# Patient Record
Sex: Female | Born: 1945 | Race: White | Hispanic: No | Marital: Single | Smoking: Former smoker
Health system: Southern US, Community
[De-identification: ages and names within clinical notes are randomized; demographics above are authoritative.]

## PROBLEM LIST (undated history)

## (undated) DIAGNOSIS — N94819 Vulvodynia, unspecified: Secondary | ICD-10-CM

## (undated) DIAGNOSIS — F329 Major depressive disorder, single episode, unspecified: Secondary | ICD-10-CM

## (undated) DIAGNOSIS — T7840XA Allergy, unspecified, initial encounter: Secondary | ICD-10-CM

## (undated) DIAGNOSIS — F1010 Alcohol abuse, uncomplicated: Secondary | ICD-10-CM

## (undated) DIAGNOSIS — Z973 Presence of spectacles and contact lenses: Secondary | ICD-10-CM

## (undated) DIAGNOSIS — C801 Malignant (primary) neoplasm, unspecified: Secondary | ICD-10-CM

## (undated) DIAGNOSIS — M1990 Unspecified osteoarthritis, unspecified site: Secondary | ICD-10-CM

## (undated) DIAGNOSIS — J309 Allergic rhinitis, unspecified: Secondary | ICD-10-CM

## (undated) DIAGNOSIS — Z809 Family history of malignant neoplasm, unspecified: Secondary | ICD-10-CM

## (undated) DIAGNOSIS — H9190 Unspecified hearing loss, unspecified ear: Secondary | ICD-10-CM

## (undated) DIAGNOSIS — IMO0001 Reserved for inherently not codable concepts without codable children: Secondary | ICD-10-CM

## (undated) DIAGNOSIS — F32A Depression, unspecified: Secondary | ICD-10-CM

## (undated) DIAGNOSIS — D1803 Hemangioma of intra-abdominal structures: Secondary | ICD-10-CM

## (undated) HISTORY — DX: Allergy, unspecified, initial encounter: T78.40XA

## (undated) HISTORY — DX: Alcohol abuse, uncomplicated: F10.10

## (undated) HISTORY — DX: Hemangioma of intra-abdominal structures: D18.03

## (undated) HISTORY — PX: OTHER SURGICAL HISTORY: SHX169

## (undated) HISTORY — DX: Depression, unspecified: F32.A

## (undated) HISTORY — DX: Allergic rhinitis, unspecified: J30.9

## (undated) HISTORY — DX: Major depressive disorder, single episode, unspecified: F32.9

## (undated) HISTORY — DX: Reserved for inherently not codable concepts without codable children: IMO0001

## (undated) HISTORY — DX: Malignant (primary) neoplasm, unspecified: C80.1

## (undated) HISTORY — PX: WISDOM TOOTH EXTRACTION: SHX21

## (undated) HISTORY — DX: Vulvodynia, unspecified: N94.819

## (undated) HISTORY — PX: TONSILLECTOMY: SUR1361

## (undated) HISTORY — DX: Presence of spectacles and contact lenses: Z97.3

## (undated) HISTORY — DX: Unspecified hearing loss, unspecified ear: H91.90

## (undated) HISTORY — DX: Family history of malignant neoplasm, unspecified: Z80.9

## (undated) HISTORY — DX: Unspecified osteoarthritis, unspecified site: M19.90

---

## 2000-08-27 DIAGNOSIS — C801 Malignant (primary) neoplasm, unspecified: Secondary | ICD-10-CM

## 2000-08-27 HISTORY — DX: Malignant (primary) neoplasm, unspecified: C80.1

## 2000-08-27 HISTORY — PX: MASTECTOMY: SHX3

## 2000-10-31 ENCOUNTER — Other Ambulatory Visit: Admission: RE | Admit: 2000-10-31 | Discharge: 2000-10-31 | Payer: Self-pay | Admitting: Radiology

## 2000-11-05 ENCOUNTER — Other Ambulatory Visit: Admission: RE | Admit: 2000-11-05 | Discharge: 2000-11-05 | Payer: Self-pay | Admitting: Surgery

## 2000-12-02 ENCOUNTER — Encounter (INDEPENDENT_AMBULATORY_CARE_PROVIDER_SITE_OTHER): Payer: Self-pay | Admitting: Specialist

## 2000-12-02 ENCOUNTER — Inpatient Hospital Stay (HOSPITAL_COMMUNITY): Admission: RE | Admit: 2000-12-02 | Discharge: 2000-12-04 | Payer: Self-pay | Admitting: Surgery

## 2001-01-06 ENCOUNTER — Encounter: Payer: Self-pay | Admitting: Oncology

## 2001-01-06 ENCOUNTER — Ambulatory Visit (HOSPITAL_COMMUNITY): Admission: RE | Admit: 2001-01-06 | Discharge: 2001-01-06 | Payer: Self-pay | Admitting: Oncology

## 2001-07-09 ENCOUNTER — Encounter: Admission: RE | Admit: 2001-07-09 | Discharge: 2001-07-09 | Payer: Self-pay | Admitting: *Deleted

## 2001-07-09 ENCOUNTER — Encounter: Payer: Self-pay | Admitting: *Deleted

## 2001-07-30 ENCOUNTER — Encounter: Payer: Self-pay | Admitting: *Deleted

## 2001-07-30 ENCOUNTER — Encounter: Admission: RE | Admit: 2001-07-30 | Discharge: 2001-07-30 | Payer: Self-pay | Admitting: *Deleted

## 2001-08-05 ENCOUNTER — Ambulatory Visit (HOSPITAL_COMMUNITY): Admission: RE | Admit: 2001-08-05 | Discharge: 2001-08-05 | Payer: Self-pay | Admitting: Gastroenterology

## 2003-08-28 DIAGNOSIS — D1803 Hemangioma of intra-abdominal structures: Secondary | ICD-10-CM

## 2003-08-28 HISTORY — DX: Hemangioma of intra-abdominal structures: D18.03

## 2003-12-13 ENCOUNTER — Other Ambulatory Visit: Admission: RE | Admit: 2003-12-13 | Discharge: 2003-12-13 | Payer: Self-pay | Admitting: Family Medicine

## 2003-12-13 LAB — HM PAP SMEAR

## 2003-12-20 ENCOUNTER — Encounter: Admission: RE | Admit: 2003-12-20 | Discharge: 2003-12-20 | Payer: Self-pay | Admitting: Family Medicine

## 2003-12-27 ENCOUNTER — Ambulatory Visit (HOSPITAL_COMMUNITY): Admission: RE | Admit: 2003-12-27 | Discharge: 2003-12-27 | Payer: Self-pay | Admitting: Family Medicine

## 2005-01-29 ENCOUNTER — Ambulatory Visit: Payer: Self-pay | Admitting: Oncology

## 2006-01-28 ENCOUNTER — Ambulatory Visit: Payer: Self-pay | Admitting: Oncology

## 2006-01-30 LAB — COMPREHENSIVE METABOLIC PANEL
ALT: 20 U/L (ref 0–40)
AST: 17 U/L (ref 0–37)
Albumin: 4.2 g/dL (ref 3.5–5.2)
Alkaline Phosphatase: 68 U/L (ref 39–117)
Calcium: 9.3 mg/dL (ref 8.4–10.5)
Chloride: 105 mEq/L (ref 96–112)
Potassium: 4.9 mEq/L (ref 3.5–5.3)
Sodium: 141 mEq/L (ref 135–145)
Total Protein: 6.8 g/dL (ref 6.0–8.3)

## 2006-01-30 LAB — CBC WITH DIFFERENTIAL/PLATELET
BASO%: 0.7 % (ref 0.0–2.0)
EOS%: 3.6 % (ref 0.0–7.0)
HGB: 13 g/dL (ref 11.6–15.9)
MCH: 32.2 pg (ref 26.0–34.0)
MCV: 96.5 fL (ref 81.0–101.0)
MONO%: 12.4 % (ref 0.0–13.0)
RBC: 4.04 10*6/uL (ref 3.70–5.32)
RDW: 13.8 % (ref 11.3–14.5)
lymph#: 1.5 10*3/uL (ref 0.9–3.3)

## 2007-01-30 ENCOUNTER — Ambulatory Visit: Payer: Self-pay | Admitting: Oncology

## 2007-02-03 LAB — CBC WITH DIFFERENTIAL/PLATELET
Eosinophils Absolute: 0.1 10*3/uL (ref 0.0–0.5)
MONO#: 0.5 10*3/uL (ref 0.1–0.9)
NEUT#: 2.8 10*3/uL (ref 1.5–6.5)
Platelets: 251 10*3/uL (ref 145–400)
RBC: 4.19 10*6/uL (ref 3.70–5.32)
RDW: 12.8 % (ref 11.3–14.5)
WBC: 4.7 10*3/uL (ref 3.9–10.0)

## 2007-02-03 LAB — COMPREHENSIVE METABOLIC PANEL
Albumin: 4.4 g/dL (ref 3.5–5.2)
CO2: 24 mEq/L (ref 19–32)
Glucose, Bld: 76 mg/dL (ref 70–99)
Potassium: 4.4 mEq/L (ref 3.5–5.3)
Sodium: 142 mEq/L (ref 135–145)
Total Protein: 6.7 g/dL (ref 6.0–8.3)

## 2007-02-03 LAB — LACTATE DEHYDROGENASE: LDH: 139 U/L (ref 94–250)

## 2008-02-04 LAB — HM MAMMOGRAPHY

## 2008-02-19 ENCOUNTER — Ambulatory Visit: Payer: Self-pay | Admitting: Family Medicine

## 2008-02-23 ENCOUNTER — Ambulatory Visit: Payer: Self-pay | Admitting: Oncology

## 2008-02-26 LAB — COMPREHENSIVE METABOLIC PANEL
ALT: 23 U/L (ref 0–35)
AST: 18 U/L (ref 0–37)
Albumin: 4.5 g/dL (ref 3.5–5.2)
BUN: 14 mg/dL (ref 6–23)
CO2: 27 mEq/L (ref 19–32)
Calcium: 10 mg/dL (ref 8.4–10.5)
Chloride: 105 mEq/L (ref 96–112)
Creatinine, Ser: 0.77 mg/dL (ref 0.40–1.20)
Potassium: 5 mEq/L (ref 3.5–5.3)

## 2008-02-26 LAB — CBC WITH DIFFERENTIAL/PLATELET
BASO%: 0.3 % (ref 0.0–2.0)
Basophils Absolute: 0 10*3/uL (ref 0.0–0.1)
EOS%: 1.7 % (ref 0.0–7.0)
HCT: 40.3 % (ref 34.8–46.6)
HGB: 13.7 g/dL (ref 11.6–15.9)
MCH: 31.8 pg (ref 26.0–34.0)
MONO#: 0.7 10*3/uL (ref 0.1–0.9)
NEUT%: 75.9 % (ref 39.6–76.8)
RDW: 13.3 % (ref 11.3–14.5)
WBC: 9.5 10*3/uL (ref 3.9–10.0)
lymph#: 1.4 10*3/uL (ref 0.9–3.3)

## 2008-02-26 LAB — LACTATE DEHYDROGENASE: LDH: 164 U/L (ref 94–250)

## 2008-04-26 ENCOUNTER — Ambulatory Visit: Payer: Self-pay | Admitting: Family Medicine

## 2009-08-27 HISTORY — PX: COLONOSCOPY: SHX174

## 2009-08-31 ENCOUNTER — Ambulatory Visit: Payer: Self-pay | Admitting: Family Medicine

## 2010-05-23 ENCOUNTER — Encounter: Admission: RE | Admit: 2010-05-23 | Discharge: 2010-05-23 | Payer: Self-pay | Admitting: Family Medicine

## 2010-05-23 ENCOUNTER — Ambulatory Visit: Payer: Self-pay | Admitting: Family Medicine

## 2011-01-12 NOTE — Op Note (Signed)
[place]. [facility]  Patient:    Emily Cowan, Emily Cowan                        MRN: [id number] Proc. Date: 12/02/00 Adm. Date:  [id number] Attending:  Bonnetta Barry CC:         Jeralyn Ruths, M.D.  Heather Roberts, M.D.  Teena Irani. Odis Luster, M.D.   Operative Report  PREOPERATIVE DIAGNOSIS:  Right breast carcinoma.  POSTOPERATIVE DIAGNOSIS:  Right breast carcinoma.  OPERATION PERFORMED:  Right total mastectomy and sentinel lymph node biopsy.  SURGEON:  Velora Heckler, M.D.  ASSISTANT:  Rose Phi. Maple Hudson, M.D.  ANESTHESIA:  General.  ESTIMATED BLOOD LOSS:  Less than 50 cc.  PREPARATION:  Betadine.  COMPLICATIONS:  None.  INDICATIONS FOR PROCEDURE:  The patient is a 65 year old white female who presents with newly diagnosed right breast carcinoma.  The patient had noted a mass on routine monthly self examination.  She was seen by Dr. Heather Roberts and referred to Dr. Jeralyn Ruths for mammogram, ultrasound,  fine needle aspiration were performed.  This demonstrated atypical epithelial cells.  On November 05, 2000 the patient underwent core needle biopsy.  This confirmed invasive mammary carcinoma.  The patient was seen and evaluated by general surgery and is now brought to the operating room for total mastectomy, sentinel lymph node biopsy, and immediate reconstruction by Dr. Etter Sjogren from plastic surgery.  DESCRIPTION OF PROCEDURE:  The procedure was done in OR #17 at the [facility].  The patient was brought to the operating room and placed in supine position on the operating room table.  Following administration of general anesthesia, the patient was prepped and draped in the usual strict aseptic fashion.  After ascertaining that an adequate level of anesthesia had been obtained, the right breast was injected with Lymphazurin blue dye.  This was massaged for five minutes.  Neoprobe was used to map the right breast and  right axilla.  The sentinel lymph node site was marked with a marking pen. The patient was then prepped and draped  draped in the usual strict aseptic fashion.  The right axillary incision was then made with a #10 blade.  Dissection was carried down through subcutaneous tissues and hemostasis obtained with the electrocautery.  Blue lymphatic vessels were identified and followed into the low axilla.  Several small less than 1 cm lymph nodes were identified which were both radioactive and blue in color. These were excised sequentially and submitted to pathology for review.  The first four  such lymph nodes were submitted to Touch Prep and read by Dr. Hali Marry as negative for metastatic carcinoma.  Two additional lymph nodes were submitted for permanent sectioning.  Good hemostasis was noted in the axilla.  Next, an elliptical incision was made on the right chest, so as to encompass the entire nipple areolar complex.  Skin flaps were developed circumferentially using the traditional anatomic landmarks of the clavicle superiorly, the sternum medioally, the rectus sheath inferiorly and the latissimus dorsi laterally.  The breast was then reflected off of the chest wall including the fascia with the breast in leaving the pectoralis major muscle intact.  Arterial tributaries were divided between small and medium Ligaclips.  The entire breast was excised laterally and marked with a suture in the lateral position.  It was then submitted to pathology for review.  Good hemostasis was noted throughout.  The wound was irrigated copiously with  warm saline which was evacuated.  A moistened gauze pack was placed within the subcutaneous space.  The axillary wound was closed with interrupted 3-0 Vicryl sutures in the subcutaneous tissues.  The skin was closed with stainless steel staples.  At this point Dr. Etter Sjogren assumed control of the case and will dictate his reconstruction under separate  operative report.  The patient tolerated this portion of the surgery very well. DD:  12/02/00 TD:  12/02/00 Job: [id number]

## 2011-01-12 NOTE — Procedures (Signed)
[place]. [facility]  Patient:    Emily Cowan, Emily Cowan Visit Number: [id number] MRN: [id number]          Service Type: END Location: ENDO Attending Physician:  Charna Elizabeth Dictated by:   Anselmo Rod, M.D. Proc. Date: 08/05/01 Admit Date:  08/05/2001                             Procedure Report  DATE OF BIRTH:  04-15-1946  REFERRING PHYSICIAN:  Heather Roberts, M.D.  PROCEDURE PERFORMED:  Colonoscopy.  ENDOSCOPIST:  Anselmo Rod, M.D.  INSTRUMENT USED:  Olympus video colonoscope.  INDICATIONS FOR PROCEDURE:  Screening colonoscopy done in a 65 year old white female with a personal history of breast cancer and guaiac positive stools and and colonic polyps.  The patient also has a family history of GI cancers.  PREPROCEDURE PREPARATION:  Informed consent was procured from the patient. The patient was fasted for eight hours prior to the procedure and prepped with a bottle of magnesium citrate and a gallon of NuLytely the night prior to the procedure.  PREPROCEDURE PHYSICAL:  The patient had stable vital signs.  Neck supple. Chest clear to auscultation.  S1, S2 regular.  Abdomen soft with normal abdominal bowel sounds.  DESCRIPTION OF PROCEDURE:  The patient was placed in the left lateral decubitus position and sedated with 40 mg of Demerol and 4 mg of Versed intravenously.  Once the patient was adequately sedated and maintained on low-flow oxygen and continuous cardiac monitoring, the Olympus video colonoscope was advanced from the rectum to the cecum with slight difficulty secondary to some residual stool in the colon and multiple washes were done. No masses, polyps, erosions, ulcerations or diverticula were seen.  There was no evidence of hemorrhoids.  The procedure was completed to the cecum.  The ileocecal valve and the appendicular orifices were clearly visualized and photographed.  IMPRESSION: 1. Healthy-appearing colon, no masses or  polyps seen. 2. A small amount of residual stool in the colon, very small lesions could    have been missed. 3. No masses, polyps, hemorrhoids or diverticulosis seen.  RECOMMENDATIONS: 1. Repeat guaiac testing on an outpatient basis. 2. Repeat colorectal cancer screening in the next five years unless the    patient were to develop any abnormal symptoms in the interim. Dictated by:   Anselmo Rod, M.D. Attending Physician:  Charna Elizabeth DD:  08/05/01 TD:  08/05/01 Job: [id number]

## 2011-01-12 NOTE — Discharge Summary (Signed)
[place]. [facility]  Patient:    Emily Cowan, Emily Cowan                        MRN: [id number] Adm. Date:  [id number] Disc. Date: 12/04/00 Attending:  Bonnetta Barry                           Discharge Summary  DISCHARGE DIAGNOSIS:  Right breast carcinoma.  PROCEDURES: 1. Right total mastectomy with sentinel lymph node biopsy. 2. Right breast reconstruction using tissue expander.  HISTORY OF PRESENT ILLNESS:  This is a 65 year old woman who had a right suspicious area identified on right breast on mammography that confirmed a mass that had been palpated on self-exam.  Dr. Yolanda Bonine performed fine-needle aspirate that revealed atypical epithelial cells and she underwent a core needle biopsy that confirmed invasive mammary carcinoma.  After evaluation by Dr. Gerrit Friends, a total mastectomy with sentinel lymph node biopsy was planned. On evaluation by plastic surgery, she elected reconstruction using tissue expander followed as a stage procedure by an implant.  For further details of history and physical, please see the chart.  HOSPITAL COURSE:  On admission, her BMP and CBC were within normal limits. She underwent mastectomy and reconstruction with tissue expander and tolerated those procedures well.  Four separate sentinel lymph nodes were biopsied and were negative on frozen section.  Postoperatively, she did well.  She had low-grade fever of 100 degrees that responded to incentive spirometry.  Chest is very soft with no evidence of any fluid collections.  Skin flaps look healthy.  Drain is functioning.  It was felt that she was ready to be discharged.  DIET:  Regular diet.  ACTIVITY:  No lifting or raising of her right arm.  DISCHARGE MEDICATIONS: 1. Vicodin for pain. 2. Keflex 250 mg p.o. q.i.d. 3. Use incentive spirometry at least four times a day.  FOLLOWUP:  Follow up with Dr. Gerrit Friends in 10 days.  Follow up with Dr. Odis Luster in six days. DD:   12/04/00 TD:  12/04/00 Job: [id number]

## 2011-04-19 ENCOUNTER — Telehealth: Payer: Self-pay

## 2011-04-19 NOTE — Telephone Encounter (Signed)
Call pt to let her know she needs appt

## 2011-04-20 NOTE — Telephone Encounter (Signed)
Pt informed to make appt

## 2011-04-20 NOTE — Telephone Encounter (Signed)
Called for the 2nd time pt informed of

## 2011-04-26 ENCOUNTER — Encounter: Payer: Self-pay | Admitting: Family Medicine

## 2011-05-02 ENCOUNTER — Encounter: Payer: Self-pay | Admitting: Family Medicine

## 2011-05-02 ENCOUNTER — Ambulatory Visit (INDEPENDENT_AMBULATORY_CARE_PROVIDER_SITE_OTHER): Payer: Medicare Other | Admitting: Family Medicine

## 2011-05-02 DIAGNOSIS — Z0000 Encounter for general adult medical examination without abnormal findings: Secondary | ICD-10-CM

## 2011-05-02 DIAGNOSIS — Z23 Encounter for immunization: Secondary | ICD-10-CM

## 2011-05-02 DIAGNOSIS — Z853 Personal history of malignant neoplasm of breast: Secondary | ICD-10-CM

## 2011-05-02 DIAGNOSIS — F341 Dysthymic disorder: Secondary | ICD-10-CM

## 2011-05-02 DIAGNOSIS — J301 Allergic rhinitis due to pollen: Secondary | ICD-10-CM | POA: Insufficient documentation

## 2011-05-02 DIAGNOSIS — E559 Vitamin D deficiency, unspecified: Secondary | ICD-10-CM

## 2011-05-02 DIAGNOSIS — M810 Age-related osteoporosis without current pathological fracture: Secondary | ICD-10-CM | POA: Insufficient documentation

## 2011-05-02 LAB — CBC WITH DIFFERENTIAL/PLATELET
Basophils Absolute: 0 10*3/uL (ref 0.0–0.1)
Basophils Relative: 1 % (ref 0–1)
Eosinophils Absolute: 0.1 10*3/uL (ref 0.0–0.7)
Eosinophils Relative: 3 % (ref 0–5)
Lymphs Abs: 1.7 10*3/uL (ref 0.7–4.0)
MCH: 31.9 pg (ref 26.0–34.0)
MCHC: 33.5 g/dL (ref 30.0–36.0)
MCV: 95.3 fL (ref 78.0–100.0)
Neutrophils Relative %: 44 % (ref 43–77)
Platelets: 283 10*3/uL (ref 150–400)
RBC: 4.07 MIL/uL (ref 3.87–5.11)
RDW: 15.6 % — ABNORMAL HIGH (ref 11.5–15.5)

## 2011-05-02 MED ORDER — RISEDRONATE SODIUM 150 MG PO TABS: 150.0000 mg | ORAL_TABLET | ORAL | Status: AC

## 2011-05-02 NOTE — Progress Notes (Signed)
  Subjective:    Patient ID: Emily Cowan, female    DOB: 05-13-1946, 65 y.o.   MRN: [id number]  HPI She is here for complete examination. She does have underlying allergies and is handling them well. She also has a history of depression since 2002. She has been on Prozac and states that this makes her feel quite well and she is not interested in stopping. She admits to having depression symptoms probably for a much longer period of time. She also had a recent DEXA scan done which did show evidence of osteoporosis. She is now semiretired and enjoying her life. She keeps quite busy. She did have one episode of blurred vision on the left it lasted approximately 45 seconds but has had no headache, nausea, weakness, numbness or tingling. She also has started a diet program. She has been using the Atkins program. She has no other concerns or complaints. The rest of her history and social history was reviewed. She does not want a Pap done.   Review of Systems  Constitutional: Negative.   HENT: Negative.   Eyes: Negative.   Respiratory: Negative.   Cardiovascular: Negative.   Gastrointestinal: Negative.   Genitourinary: Negative.   Musculoskeletal: Negative.   Neurological: Negative.   Hematological: Negative.   Psychiatric/Behavioral: Negative.        Objective:   Physical Exam BP 102/60  Pulse 73  Ht 5\' 7"  (1.702 m)  Wt 150 lb (68.04 kg)  BMI 23.49 kg/m2  General Appearance:    Alert, cooperative, no distress, appears stated age  Head:    Normocephalic, without obvious abnormality, atraumatic  Eyes:    PERRL, conjunctiva/corneas clear, EOM's intact, fundi    benign  Ears:    Normal TM's and external ear canals  Nose:   Nares normal, mucosa normal, no drainage or sinus   tenderness  Throat:   Lips, mucosa, and tongue normal; teeth and gums normal  Neck:   Supple, no lymphadenopathy;  thyroid:  no   enlargement/tenderness/nodules; no carotid   bruit or JVD  Back:    Spine nontender, no  curvature, ROM normal, no CVA     tenderness  Lungs:     Clear to auscultation bilaterally without wheezes, rales or     ronchi; respirations unlabored  Chest Wall:    No tenderness or deformity   Heart:    Regular rate and rhythm, S1 and S2 normal, no murmur, rub   or gallop  Breast Exam:    Deferred to GYN  Abdomen:     Soft, non-tender, nondistended, normoactive bowel sounds,    no masses, no hepatosplenomegaly  Genitalia:    Deferred to GYN     Extremities:   No clubbing, cyanosis or edema  Pulses:   2+ and symmetric all extremities  Skin:   Skin color, texture, turgor normal, no rashes or lesions  Lymph nodes:   Cervical, supraclavicular, and axillary nodes normal  Neurologic:   CNII-XII intact, normal strength, sensation and gait; reflexes 2+ and symmetric throughout          Psych:   Normal mood, affect, hygiene and grooming.           Assessment & Plan:  History of breast cancer. Allergic rhinitis. Depression. Osteoporosis. A sample of Actonel was given. I will call this medication in. Watchful waiting concerning the blurred vision. I also encouraged her to use the [employer] or possibly the Mediterranean diet which is more nutritionally sound.

## 2011-05-03 LAB — COMPREHENSIVE METABOLIC PANEL
ALT: 33 U/L (ref 0–35)
ALT: 32 U/L (ref 0–35)
AST: 20 U/L (ref 0–37)
AST: 20 U/L (ref 0–37)
Albumin: 4.1 g/dL (ref 3.5–5.2)
Alkaline Phosphatase: 69 U/L (ref 39–117)
Alkaline Phosphatase: 72 U/L (ref 39–117)
Calcium: 9.7 mg/dL (ref 8.4–10.5)
Chloride: 105 mEq/L (ref 96–112)
Creat: 0.66 mg/dL (ref 0.50–1.10)
Glucose, Bld: 95 mg/dL (ref 70–99)
Potassium: 4.5 mEq/L (ref 3.5–5.3)
Sodium: 140 mEq/L (ref 135–145)
Total Protein: 6.2 g/dL (ref 6.0–8.3)

## 2011-05-03 LAB — LIPID PANEL
LDL Cholesterol: 129 mg/dL — ABNORMAL HIGH (ref 0–99)
LDL Cholesterol: 128 mg/dL — ABNORMAL HIGH (ref 0–99)
Total CHOL/HDL Ratio: 3.2 Ratio
VLDL: 9 mg/dL (ref 0–40)
VLDL: 10 mg/dL (ref 0–40)

## 2011-05-03 LAB — TSH: TSH: 2.041 u[IU]/mL (ref 0.350–4.500)

## 2011-05-04 ENCOUNTER — Other Ambulatory Visit: Payer: Self-pay | Admitting: Family Medicine

## 2011-05-04 MED ORDER — FLUOXETINE HCL 40 MG PO CAPS: 40.0000 mg | ORAL_CAPSULE | Freq: Every day | ORAL | Status: DC

## 2011-05-04 NOTE — Telephone Encounter (Signed)
PT NEEDS PROZAC RO TO GO TO [facility] NOT LOCAL PHARMACY 90 DAY SUPPLY

## 2011-05-04 NOTE — Telephone Encounter (Signed)
Called pt to inform labs ok

## 2011-05-08 ENCOUNTER — Telehealth: Payer: Self-pay

## 2011-05-08 NOTE — Telephone Encounter (Signed)
Called pt to inform her labs look good left message

## 2012-08-13 ENCOUNTER — Telehealth: Payer: Self-pay | Admitting: Family Medicine

## 2012-08-13 ENCOUNTER — Encounter: Payer: Self-pay | Admitting: Medical

## 2012-08-13 ENCOUNTER — Ambulatory Visit (INDEPENDENT_AMBULATORY_CARE_PROVIDER_SITE_OTHER): Payer: Medicare Other | Admitting: Medical

## 2012-08-13 VITALS — BP 110/80 | HR 60 | Temp 97.9°F | Resp 16 | Ht 68.0 in | Wt 164.0 lb

## 2012-08-13 DIAGNOSIS — F329 Major depressive disorder, single episode, unspecified: Secondary | ICD-10-CM

## 2012-08-13 DIAGNOSIS — N94819 Vulvodynia, unspecified: Secondary | ICD-10-CM

## 2012-08-13 DIAGNOSIS — Z0000 Encounter for general adult medical examination without abnormal findings: Secondary | ICD-10-CM

## 2012-08-13 DIAGNOSIS — G4700 Insomnia, unspecified: Secondary | ICD-10-CM

## 2012-08-13 DIAGNOSIS — M810 Age-related osteoporosis without current pathological fracture: Secondary | ICD-10-CM | POA: Insufficient documentation

## 2012-08-13 DIAGNOSIS — Z23 Encounter for immunization: Secondary | ICD-10-CM

## 2012-08-13 DIAGNOSIS — F1010 Alcohol abuse, uncomplicated: Secondary | ICD-10-CM

## 2012-08-13 DIAGNOSIS — D1803 Hemangioma of intra-abdominal structures: Secondary | ICD-10-CM

## 2012-08-13 DIAGNOSIS — Z853 Personal history of malignant neoplasm of breast: Secondary | ICD-10-CM

## 2012-08-13 LAB — CBC WITH DIFFERENTIAL/PLATELET
Eosinophils Absolute: 0.2 10*3/uL (ref 0.0–0.7)
Eosinophils Relative: 3 % (ref 0–5)
HCT: 41.4 % (ref 36.0–46.0)
Hemoglobin: 14 g/dL (ref 12.0–15.0)
Lymphs Abs: 1.9 10*3/uL (ref 0.7–4.0)
MCH: 31.9 pg (ref 26.0–34.0)
MCV: 94.3 fL (ref 78.0–100.0)
Monocytes Absolute: 0.6 10*3/uL (ref 0.1–1.0)
Monocytes Relative: 12 % (ref 3–12)
RBC: 4.39 MIL/uL (ref 3.87–5.11)

## 2012-08-13 LAB — LIPID PANEL
Cholesterol: 207 mg/dL — ABNORMAL HIGH (ref 0–200)
HDL: 57 mg/dL (ref >39)
LDL Cholesterol: 129 mg/dL — ABNORMAL HIGH (ref 0–99)
Triglycerides: 105 mg/dL (ref <150)

## 2012-08-13 LAB — COMPREHENSIVE METABOLIC PANEL
AST: 22 U/L (ref 0–37)
Alkaline Phosphatase: 85 U/L (ref 39–117)
BUN: 12 mg/dL (ref 6–23)
Glucose, Bld: 91 mg/dL (ref 70–99)
Total Bilirubin: 0.5 mg/dL (ref 0.3–1.2)

## 2012-08-13 LAB — VITAMIN B12: Vitamin B-12: 542 pg/mL (ref 211–911)

## 2012-08-13 LAB — POCT URINALYSIS DIPSTICK
Glucose, UA: NEGATIVE
Nitrite, UA: NEGATIVE
Protein, UA: NEGATIVE
Urobilinogen, UA: NEGATIVE

## 2012-08-13 LAB — MAGNESIUM: Magnesium: 2.2 mg/dL (ref 1.5–2.5)

## 2012-08-13 MED ORDER — FLUOXETINE HCL 40 MG PO CAPS: 40.0000 mg | ORAL_CAPSULE | Freq: Every day | ORAL | Status: DC

## 2012-08-13 MED ORDER — ZOLPIDEM TARTRATE 5 MG PO TABS: 5.0000 mg | ORAL_TABLET | Freq: Every evening | ORAL | Status: DC | PRN

## 2012-08-13 NOTE — Patient Instructions (Signed)
Osteoporosis  Take calcium 1200-1500 mg daily  Take Vitamin D 1000 IU daily  We will send you for Bone Density scan  Check on insurance coverage for Reclast  Alcohol use   i recommend 1 or less drinks daily  Consider counseling for depression  Consider alcoholics anonymous (AA) for support   Take extra vitamin B12 and extra folate 1mg  daily OTC  Take a multivitamin daily  Preventative care  We updated your flu vaccine today  We will set you up for repeat mammogram  Get routine daily exercise  Eat a healthy diet including fruits, vegetable, grains such as rice, pasta, breads, and protein from nuts, beans, dairy, and non red meat  We will get a copy of your colonoscopy report  Come in yearly for physical, see your eye doctor and dentist yearly  Use the ambien 1/2 -1 tablet sparingly for help with sleep  I refilled your Prozac today.

## 2012-08-13 NOTE — Telephone Encounter (Signed)
Patient is aware of her appointment at [facility] on 09/02/12 @ 930 am mammogram and bone density. CLS

## 2012-08-13 NOTE — Progress Notes (Signed)
Subjective:   HPI  Emily Cowan is a 66 y.o. female who presents for a complete physical.  Last physical a year ago. This is my first visit with her.   Normally sees Dr. Susann Givens here.    Preventative care: Last ophthalmology visit: YES/ BUT SHE NEEDS A EYE EXAM, she is an optician herself, retired Last dental visit:2013/ JUST SAW THE DENTIST DOESN'T SEE ON A REGULAR Last colonoscopy:2 TO 3 YEARS AGO/ [place] Last mammogram:2010 Last EKG:1998 Last labs:04/2011  Prior vaccinations: TD or Tdap:08/2009 Influenza:YES today Pneumococcal:08/2009 Shingles/Zostavax:08/2009  Advanced directive:YES Health care power of attorney:YES Living will:YES  Concerns: Occasionally wakes up in the middle of the night, takes a while to go back to sleep.  Has tried Ambien before a few times, took 1/4th and slept through the night.  Benadryl makes her feel dopey.  She requests a month supply for prn use.   Gets shaky if she skips meal or if goes a few hours close to lunch without eating.    Prozac - needs refills.  Takes prozac for depression.  She has a friend living with her that was homeless.  Having some financial stress taking this person in. Not seeing counseling.  Reviewed their medical, surgical, family, social, medication, and allergy history and updated chart as appropriate.   Past Medical History  Diagnosis Date  . Depression   . Osteoporosis   . Seborrheic dermatitis   . Cancer 2002    BREAST  . Allergic rhinitis   . Hearing impaired     wears hearing aids  . Wears glasses   . Alcohol abuse   . Family history of cancer     multiple  . Hemangioma of liver 2005    MRI   . Vulvodynia     longstanding, failed Premarin cream    Past Surgical History  Procedure Date  . Mastectomy 2002    rigth  . Tonsillectomy   . Wisdom tooth extraction     Family History  Problem Relation Age of Onset  . Cancer Mother     GI cancer  . Cancer Father     liver  . Stroke Father   .  Cancer Sister     vulvar  . Cancer Brother     liver  . Diabetes Neg Hx   . Heart disease Neg Hx   . Hypertension Neg Hx   . Hyperlipidemia Neg Hx   . Cancer Brother     lung    History   Social History  . Marital Status: Single    Spouse Name: N/A    Number of Children: N/A  . Years of Education: N/A   Occupational History  . Not on file.   Social History Main Topics  . Smoking status: Never Smoker   . Smokeless tobacco: Never Used  . Alcohol Use: 12.6 oz/week    21 Glasses of wine per week  . Drug Use: No  . Sexually Active: Not Currently   Other Topics Concern  . Not on file   Social History Narrative   Single, no children, retired, lesbian but no current relationship; exercise- working in the yard, cutting down trees, Aeronautical engineer, walking the dogs.  Retired Sales executive.    Current Outpatient Prescriptions on File Prior to Visit  Medication Sig Dispense Refill  . FLUoxetine (PROZAC) 40 MG capsule Take 1 capsule (40 mg total) by mouth daily.  90 capsule  4  . Glucosamine-Chondroit-Vit C-Mn (GLUCOSAMINE CHONDR 1500 COMPLX) CAPS  Take 1 capsule by mouth daily.        . Multiple Vitamins-Minerals (MULTIVITAMIN WITH MINERALS) tablet Take 1 tablet by mouth daily.        Marland Kitchen ibuprofen (ADVIL,MOTRIN) 100 MG chewable tablet Chew 100 mg by mouth every 8 (eight) hours as needed.        . zolpidem (AMBIEN) 5 MG tablet Take 1 tablet (5 mg total) by mouth at bedtime as needed for sleep.  20 tablet  0    No Known Allergies   Review of Systems Constitutional: -fever, -chills, -sweats, +unexpected weight change, -decreased appetite, -fatigue Allergy: -sneezing, -itching, -congestion Dermatology: -changing moles, --rash, -lumps ENT: -runny nose, -ear pain, -sore throat, -hoarseness, -sinus pain, -teeth pain, - ringing in ears, -hearing loss, -nosebleeds Cardiology: -chest pain, -palpitations, -swelling, -difficulty breathing when lying flat, -waking up short of breath Respiratory:  -cough, -shortness of breath, -difficulty breathing with exercise or exertion, -wheezing, -coughing up blood Gastroenterology: -abdominal pain, -nausea, -vomiting, -diarrhea, -constipation, -blood in stool, -changes in bowel movement, -difficulty swallowing or eating Hematology: -bleeding, -bruising  Musculoskeletal: +joint aches, -muscle aches, -joint swelling, +back pain, +neck pain, -cramping, -changes in gait Ophthalmology: denies vision changes, eye redness, itching, discharge Urology: -burning with urination, -difficulty urinating, -blood in urine, -urinary frequency, -urgency, -incontinence Neurology: -headache, -weakness, -tingling, -numbness, -memory loss, -falls, -dizziness Psychology: -depressed mood, -agitation, +sleep problems     Objective:   Physical Exam  Reviewed nurse notes, vitals.  General appearance: alert, no distress, WD/WN, white female, looks stated age Skin: dry, scattered macules, freckles, no particular worrisome lesions HEENT: normocephalic, conjunctiva/corneas normal, sclerae anicteric, PERRLA, EOMi, TMs pearly, hearing aids present, nares patent, no discharge or erythema, pharynx normal Oral cavity: MMM, tongue normal, teeth in good repair Neck: supple, no lymphadenopathy, no thyromegaly, no masses, normal ROM, no bruits Chest: non tender, normal shape and expansion Heart: RRR, normal S1, S2, no murmurs Lungs: CTA bilaterally, no wheezes, rhonchi, or rales Abdomen: +bs, soft, non tender, non distended, liver seems enlarged, no masses, no splenomegaly, no bruits Back: non tender, normal ROM, no scoliosis Musculoskeletal: upper extremities non tender, no obvious deformity, normal ROM throughout, lower extremities non tender, no obvious deformity, normal ROM throughout Extremities: no edema, no cyanosis, no clubbing Pulses: 1+ symmetric, upper and lower extremities, normal cap refill Neurological: alert, oriented x 3, CN2-12 intact, strength normal upper  extremities and lower extremities, sensation normal throughout, DTRs 2+ throughout, no cerebellar signs, gait normal Psychiatric: normal affect, behavior normal, pleasant  Breast: right breast with horizontal surgical scar, implant present, otherwise bilat breasts nontender, no masses or lumps, no skin changes, no nipple discharge or inversion, no axillary lymphadenopathy Gyn: mild atrophic changes externally, no worrisome lesions but there does seem to be some hypopigmentation of vaginal introitus suggestive of some sclerosis, she declines a speculum exam.  Palpated pelvic region externally, and uterus and adnexa don't appear enlarged, nontender, no masses.  Exam chaperoned by nurse. Rectal: anus normal appearing, normal tone, no masses, occult negative stool    Assessment and Plan :    Encounter Diagnoses  Name Primary?  . Routine general medical examination at a health care facility Yes  . Osteoporosis   . History of breast cancer   . Depression   . Insomnia   . Alcohol abuse   . Vulvodynia   . Hemangioma of liver   . Need for influenza vaccination     Physical exam - discussed healthy lifestyle, diet, exercise, preventative care, vaccinations, and addressed  their concerns.  Handout given.  osteoporosis - failed boniva.  Wants to try reclast.  She will check insurance coverage and let me know.  We set her up for repeat bone density scan.  Hx/o breast cancer - advised monthly SBE, repeat mammogram  Depression - longstanding, consider counseling.  Refilled prozac, discussed her concerns.    Insomnia - prn sparing use of zolpidem, discussed sleep hygiene  Alcohol abuse - counseled on this, recommended counseling, limiting consumption, advised she consider AA.  Vulvodynia - declines gyn referral, offered premarin cream for atrophic changes, but she declines.  Hemangioma of liver - once we have the preventative things out of the way, consider repeat abdominal imaging.  Need for  influenza vaccine - vaccine counseling, VIS and flu vaccine given  Follow-up pending studies.

## 2012-08-13 NOTE — Addendum Note (Signed)
Addended by: Janeice Robinson on: 08/13/2012 04:06 PM   Modules accepted: Orders

## 2012-08-22 DIAGNOSIS — Z23 Encounter for immunization: Secondary | ICD-10-CM

## 2012-08-22 NOTE — Addendum Note (Signed)
Addended by: Leretha Dykes L on: 08/22/2012 11:25 AM   Modules accepted: Orders

## 2012-09-02 ENCOUNTER — Encounter: Payer: Self-pay | Admitting: Medical

## 2012-09-02 LAB — HM MAMMOGRAPHY

## 2012-10-24 ENCOUNTER — Encounter: Payer: Self-pay | Admitting: Medical

## 2012-10-24 NOTE — Telephone Encounter (Signed)
Rx refill request

## 2012-10-25 ENCOUNTER — Other Ambulatory Visit: Payer: Self-pay | Admitting: Medical

## 2012-10-25 MED ORDER — ZOLPIDEM TARTRATE 5 MG PO TABS: 5.0000 mg | ORAL_TABLET | Freq: Every evening | ORAL | Status: DC | PRN

## 2012-10-27 ENCOUNTER — Telehealth: Payer: Self-pay | Admitting: Family Medicine

## 2012-10-27 NOTE — Progress Notes (Signed)
I called out Ambien to the patients pharmacy. CLS Per Crosby Oyster PA-C

## 2012-10-27 NOTE — Telephone Encounter (Signed)
I called out Ambien per David Tysinger PA-C. CLS 

## 2013-02-11 ENCOUNTER — Encounter: Payer: Self-pay | Admitting: Medical

## 2013-02-11 ENCOUNTER — Telehealth: Payer: Self-pay | Admitting: Family Medicine

## 2013-02-11 NOTE — Telephone Encounter (Signed)
See msg below.  Please send letter as requested for hearing evaluation

## 2013-02-11 NOTE — Telephone Encounter (Signed)
    Would you please fax a note to [facility] stating my need for a hearing test. Think I forgot to mention it when I last saw you. Their fax # is [phone].    Thanks so much. Hope you are well.   Lenore Manner, this was a message from the patient. Can you type the letter and I will fax it to her. CLS

## 2013-02-12 NOTE — Telephone Encounter (Signed)
Forward to [employer]

## 2013-02-16 NOTE — Telephone Encounter (Signed)
Emily Cowan, I look in her chart and I didn't see a letter. Do you type one up? CLS

## 2013-02-16 NOTE — Telephone Encounter (Signed)
Just refer to [facility] for hearing concerns and eval.  This should be sufficient.  Let patient know

## 2013-02-17 ENCOUNTER — Encounter: Payer: Self-pay | Admitting: Family Medicine

## 2013-02-17 NOTE — Telephone Encounter (Signed)
I fax a letter over to the hearing clinic for the patients request. CLS

## 2013-02-27 ENCOUNTER — Encounter: Payer: Self-pay | Admitting: Medical

## 2013-03-02 ENCOUNTER — Encounter: Payer: Self-pay | Admitting: Medical

## 2013-03-02 ENCOUNTER — Encounter: Payer: Self-pay | Admitting: Family Medicine

## 2013-03-03 ENCOUNTER — Encounter: Payer: Self-pay | Admitting: Internal Medicine

## 2013-03-11 ENCOUNTER — Telehealth: Payer: Self-pay | Admitting: Family Medicine

## 2013-03-11 NOTE — Telephone Encounter (Signed)
I called and I left a message for the patient to call back and set up an appointment. CLS

## 2013-03-11 NOTE — Telephone Encounter (Signed)
Message copied by Janeice Robinson on Wed Mar 11, 2013  2:38 PM ------      Message from: Jac Canavan      Created: Tue Mar 03, 2013  8:17 PM       Have her f/u to discuss bone density results, options for therapy ------

## 2013-03-16 ENCOUNTER — Other Ambulatory Visit: Payer: Self-pay | Admitting: Medical

## 2013-03-16 ENCOUNTER — Telehealth: Payer: Self-pay | Admitting: Internal Medicine

## 2013-03-16 MED ORDER — ZOLPIDEM TARTRATE 5 MG PO TABS: 5.0000 mg | ORAL_TABLET | Freq: Every evening | ORAL | Status: DC | PRN

## 2013-03-16 NOTE — Telephone Encounter (Signed)
Refill request for zolpidem tartrate 5mg  to [facility]

## 2013-03-16 NOTE — Telephone Encounter (Signed)
Call it out 

## 2013-03-17 NOTE — Telephone Encounter (Signed)
I called out the patients Ambien per David Tysinger PAC. CLS 

## 2013-04-01 ENCOUNTER — Other Ambulatory Visit: Payer: Self-pay

## 2013-07-02 ENCOUNTER — Other Ambulatory Visit: Payer: Self-pay

## 2013-07-14 ENCOUNTER — Telehealth: Payer: Self-pay | Admitting: Internal Medicine

## 2013-07-14 MED ORDER — ZOLPIDEM TARTRATE 5 MG PO TABS: 5.0000 mg | ORAL_TABLET | Freq: Every evening | ORAL | Status: DC | PRN

## 2013-07-14 NOTE — Telephone Encounter (Signed)
Needs appt

## 2013-07-14 NOTE — Telephone Encounter (Signed)
Refill request for ambien 10mg  to [facility]. Looks like pt has an appt in December for physical

## 2013-07-14 NOTE — Telephone Encounter (Signed)
Called in med 

## 2013-07-14 NOTE — Telephone Encounter (Signed)
I left a detailed message on the voicemail for the patient. CLS

## 2013-08-14 ENCOUNTER — Ambulatory Visit (INDEPENDENT_AMBULATORY_CARE_PROVIDER_SITE_OTHER): Payer: Medicare Other | Admitting: Medical

## 2013-08-14 ENCOUNTER — Encounter: Payer: Self-pay | Admitting: Medical

## 2013-08-14 VITALS — BP 120/82 | HR 72 | Wt 153.0 lb

## 2013-08-14 DIAGNOSIS — G4700 Insomnia, unspecified: Secondary | ICD-10-CM

## 2013-08-14 DIAGNOSIS — Z23 Encounter for immunization: Secondary | ICD-10-CM

## 2013-08-14 DIAGNOSIS — M810 Age-related osteoporosis without current pathological fracture: Secondary | ICD-10-CM

## 2013-08-14 DIAGNOSIS — Z0000 Encounter for general adult medical examination without abnormal findings: Secondary | ICD-10-CM

## 2013-08-14 DIAGNOSIS — F329 Major depressive disorder, single episode, unspecified: Secondary | ICD-10-CM

## 2013-08-14 DIAGNOSIS — F32A Depression, unspecified: Secondary | ICD-10-CM

## 2013-08-14 DIAGNOSIS — M5430 Sciatica, unspecified side: Secondary | ICD-10-CM

## 2013-08-14 DIAGNOSIS — M5431 Sciatica, right side: Secondary | ICD-10-CM

## 2013-08-14 DIAGNOSIS — R209 Unspecified disturbances of skin sensation: Secondary | ICD-10-CM

## 2013-08-14 DIAGNOSIS — R200 Anesthesia of skin: Secondary | ICD-10-CM

## 2013-08-14 DIAGNOSIS — IMO0002 Reserved for concepts with insufficient information to code with codable children: Secondary | ICD-10-CM

## 2013-08-14 DIAGNOSIS — M5410 Radiculopathy, site unspecified: Secondary | ICD-10-CM

## 2013-08-14 LAB — POCT URINALYSIS DIPSTICK
Blood, UA: NEGATIVE
Ketones, UA: NEGATIVE
Protein, UA: NEGATIVE
Spec Grav, UA: 1.01
Urobilinogen, UA: NEGATIVE
pH, UA: 6

## 2013-08-14 LAB — COMPREHENSIVE METABOLIC PANEL
Albumin: 4.1 g/dL (ref 3.5–5.2)
Alkaline Phosphatase: 72 U/L (ref 39–117)
BUN: 19 mg/dL (ref 6–23)
CO2: 30 mEq/L (ref 19–32)
Calcium: 9.4 mg/dL (ref 8.4–10.5)
Creat: 0.66 mg/dL (ref 0.50–1.10)
Glucose, Bld: 86 mg/dL (ref 70–99)
Potassium: 5.1 mEq/L (ref 3.5–5.3)

## 2013-08-14 LAB — CBC
HCT: 39.2 % (ref 36.0–46.0)
Hemoglobin: 13.3 g/dL (ref 12.0–15.0)
MCH: 32.4 pg (ref 26.0–34.0)
RBC: 4.11 MIL/uL (ref 3.87–5.11)

## 2013-08-14 MED ORDER — ASPIRIN 81 MG PO TABS: 81.0000 mg | ORAL_TABLET | Freq: Every day | ORAL | Status: DC

## 2013-08-14 MED ORDER — ZOLPIDEM TARTRATE 5 MG PO TABS: 5.0000 mg | ORAL_TABLET | Freq: Every evening | ORAL | Status: DC | PRN

## 2013-08-14 MED ORDER — ALENDRONATE SODIUM 70 MG PO TABS: 70.0000 mg | ORAL_TABLET | ORAL | Status: DC

## 2013-08-14 MED ORDER — HYDROCODONE-ACETAMINOPHEN 5-325 MG PO TABS: 1.0000 | ORAL_TABLET | Freq: Four times a day (QID) | ORAL | Status: DC | PRN

## 2013-08-14 MED ORDER — FLUOXETINE HCL 40 MG PO CAPS: 40.0000 mg | ORAL_CAPSULE | Freq: Every day | ORAL | Status: DC

## 2013-08-14 NOTE — Addendum Note (Signed)
Addended by: Laureen Ochs F on: 08/14/2013 05:10 PM   Modules accepted: Orders

## 2013-08-14 NOTE — Progress Notes (Signed)
Subjective:   HPI  Emily Cowan is a 67 y.o. female who presents for a complete physical.  Last physical a year ago.   Preventative care: Last ophthalmology visit: has seen eye doctor in last year, early cataracts Last dental visit: sees dentist regularly Last colonoscopy:2 TO 3 YEARS AGO/ [place] Last mammogram:08/2012 Last EKG:1998 Last labs: year ago  Prior vaccinations: TD or Tdap:08/2009 Influenza:YES today Pneumococcal: up to date Shingles/Zostavax: up to date  Advanced directive:YES Health care power of attorney:YES Living will:YES  Concerns: Occasionally wakes up in the middle of the night, takes a while to go back to sleep.  Uses 1/2 of 5mg  tablet of Ambien nightly. In the past has tried Tylenol PM, melatonin without improvement. Benadryl makes her too groggy the next morning.  Prozac - needs refills.  Takes prozac for depression.  Does well on this, no c/o.  Currently drinking 2 glasses of wine nightly.   History of osteoporosis, she can't recall whether she had side effects, but has tried Actonel and once monthly Boniva prior.  Currently is taking calcium and vitamin D over-the-counter, getting some exercise.  [place] but is trying to move to something smaller as she can no longer keep up the property, too much to do alone.  She was recently seen at [facility] on 08/07/13 for sciatica, back pain, right leg numbness. She notes that this came on suddenly last week, severe enough to take her to the hospital. She was prescribed oxycodone, 5 mg, Flexeril 10 mg, and 40 mg of prednisone daily for 5 days. Currently her pain level is 1-2/10, but she has ongoing right leg numbness along the front of her shin and great toe and top of her foot, has some change in gait.  She is much improved, but has concerns about the leg numbness  Reviewed their medical, surgical, family, social, medication, and allergy history and updated  chart as appropriate.   Past Medical History  Diagnosis Date  . Depression   . Osteoporosis   . Seborrheic dermatitis   . Cancer 2002    BREAST  . Allergic rhinitis   . Hearing impaired     wears hearing aids  . Wears glasses   . Alcohol abuse   . Family history of cancer     multiple  . Hemangioma of liver 2005    MRI   . Vulvodynia     longstanding, failed Premarin cream    Past Surgical History  Procedure Laterality Date  . Mastectomy  2002    right  . Tonsillectomy    . Wisdom tooth extraction    . Colonoscopy  2011    in [facility]    Family History  Problem Relation Age of Onset  . Cancer Mother     GI cancer  . Cancer Father     liver  . Stroke Father   . Cancer Sister     vulvar  . Cancer Brother     liver  . Diabetes Neg Hx   . Heart disease Neg Hx   . Hypertension Neg Hx   . Hyperlipidemia Neg Hx   . Cancer Brother     lung    History   Social History  . Marital Status: Single    Spouse Name: N/A    Number of Children: N/A  . Years of Education: N/A   Occupational History  . Not on file.  Social History Main Topics  . Smoking status: Never Smoker   . Smokeless tobacco: Never Used  . Alcohol Use: 8.4 oz/week    14 Glasses of wine per week  . Drug Use: No  . Sexual Activity: Not Currently   Other Topics Concern  . Not on file   Social History Narrative   Single, no children, retired, lesbian but no current relationship; exercise- working in the yard, cutting down trees, Aeronautical engineer, walking the dogs.  Retired Sales executive.  [place], considering move to something smaller, more manageable as of 12/14.    Current Outpatient Prescriptions on File Prior to Visit  Medication Sig Dispense Refill  . FLUoxetine (PROZAC) 40 MG capsule Take 1 capsule (40 mg total) by mouth daily.  90 capsule  4  . Glucosamine-Chondroit-Vit C-Mn (GLUCOSAMINE CHONDR 1500 COMPLX) CAPS Take 1 capsule by mouth daily.        . Multiple  Vitamins-Minerals (MULTIVITAMIN WITH MINERALS) tablet Take 1 tablet by mouth daily.        Marland Kitchen zolpidem (AMBIEN) 5 MG tablet Take 1 tablet (5 mg total) by mouth at bedtime as needed for sleep.  30 tablet  0  . ibuprofen (ADVIL,MOTRIN) 100 MG chewable tablet Chew 100 mg by mouth every 8 (eight) hours as needed.         No current facility-administered medications on file prior to visit.    No Known Allergies   Review of Systems Constitutional: -fever, -chills, -sweats, +unexpected weight change, -decreased appetite, -fatigue Allergy: -sneezing, -itching, -congestion Dermatology: -changing moles, --rash, -lumps ENT: -runny nose, -ear pain, -sore throat, -hoarseness, -sinus pain, -teeth pain, - ringing in ears, -hearing loss, -nosebleeds Cardiology: -chest pain, -palpitations, -swelling, -difficulty breathing when lying flat, -waking up short of breath Respiratory: -cough, -shortness of breath, -difficulty breathing with exercise or exertion, -wheezing, -coughing up blood Gastroenterology: -abdominal pain, -nausea, -vomiting, -diarrhea, -constipation, -blood in stool, -changes in bowel movement, -difficulty swallowing or eating Hematology: -bleeding, -bruising  Musculoskeletal: +joint aches, -muscle aches, -joint swelling, +back pain, +neck pain, -cramping, -changes in gait Ophthalmology: denies vision changes, eye redness, itching, discharge Urology: -burning with urination, -difficulty urinating, -blood in urine, -urinary frequency, -urgency, -incontinence Neurology: -headache, -weakness, -tingling, -numbness, -memory loss, -falls, -dizziness Psychology: -depressed mood, -agitation, +sleep problems     Objective:   Physical Exam  BP 120/82  Pulse 72  Wt 153 lb (69.4 kg).  Reviewed prior years's vitals as well.   General appearance: alert, no distress, WD/WN, white female, looks stated age Skin: dry, scattered macules, freckles, no particular worrisome lesions HEENT: normocephalic,  conjunctiva/corneas normal, sclerae anicteric, PERRLA, EOMi, TMs pearly, hearing aids present, nares patent, no discharge or erythema, pharynx normal Oral cavity: MMM, tongue normal, teeth in good repair Neck: supple, no lymphadenopathy, no thyromegaly, no masses, normal ROM, no bruits Chest: non tender, normal shape and expansion Heart: RRR, normal S1, S2, no murmurs Lungs: CTA bilaterally, no wheezes, rhonchi, or rales Abdomen: +bs, soft, non tender, non distended, liver seems enlarged, no masses, no splenomegaly, no bruits Back: non tender, normal ROM, no scoliosis Musculoskeletal: upper extremities non tender, no obvious deformity, normal ROM throughout, lower extremities non tender, no obvious deformity, normal ROM throughout Extremities: no edema, no cyanosis, no clubbing Pulses: 1+ symmetric, upper and lower extremities, normal cap refill Neurological: alert, oriented x 3, CN2-12 intact, strength normal upper extremities and lower extremities, sensation normal throughout, DTRs 2+ throughout, no cerebellar signs, gait normal Psychiatric: normal affect, behavior normal,  pleasant  Breast: right breast with horizontal surgical scar, implant present, otherwise bilat breasts nontender, no masses or lumps, no skin changes, no nipple discharge or inversion, no axillary lymphadenopathy Gyn: mild atrophic changes externally, no worrisome lesions but there does seem to be some hypopigmentation of vaginal introitus suggestive of some sclerosis, she declines a speculum exam.  Palpated pelvic region externally.  Exam chaperoned by nurse. Rectal: deferred at her request today    Assessment and Plan :    Encounter Diagnoses  Name Primary?  . Annual physical exam Yes  . Osteoporosis   . Depression   . Radiculopathy of leg   . Numbness of foot   . Sciatica of right side   . Insomnia   . Need for prophylactic vaccination and inoculation against influenza     Physical exam - discussed healthy  lifestyle, diet, exercise, preventative care, vaccinations, and addressed their concerns.  Handout given.  osteoporosis - failed boniva.  We were unable to get Reclast approved last year. She is agreeable to beginning once weekly Fosamax. I reviewed her bone density scan from last year which was abnormal showing osteoporosis and decreased bone density from the prior. Advise she cut back on alcohol, continue exercise, continue calcium plus vitamin D.  Hx/o breast cancer - advised monthly SBE, repeat mammogram  Depression - longstanding, seems to do well on Prozac     Insomnia - discussed sleep hygiene, advise she try and not use Ambien every single day.  She is using 1/2 of the tablet.  Alcohol  - counseled on this, advise she limit herself to one glass of wine nightly  Vulvodynia - declines gyn referral, we discussed the inability to do a full exam. She will consider doing a baseline pelvic ultrasound without transvaginal has at least some method of screening for gynecological cancer.   Need for influenza vaccine - vaccine counseling, VIS and flu vaccine given  Radiculopathy, sciatica-discussed her exam findings and symptoms. Prescription today for hydrocodone when necessary, begin Aleve once daily for the next 7-10 days, she has finished prednisone and Flexeril and oxycodone. We will refer to physical therapy.  Follow-up pending studies.

## 2013-08-14 NOTE — Patient Instructions (Signed)
Begin once weekly Fosamax to help with osteoporosis. Continue over-the-counter calcium plus vitamin D daily.  We will refer you for physical therapy for the sciatica and leg numbness.  For now take Aleve over-the-counter, 1 daily. If you have severe pain, you can use hydrocodone pain medicine I prescribed, as needed.  If not much improved in 2-3 weeks despite therapy, we will need to see you back, potentially do an MRI and go from there.  Try using your Ambien no more than 4 days per week, and certainly don't increase more than you are doing.  Try and limit alcohol to no more than one glass per night.  Consider mammogram yearly given your history of breast cancer.

## 2013-08-15 LAB — VITAMIN D 25 HYDROXY (VIT D DEFICIENCY, FRACTURES): Vit D, 25-Hydroxy: 41 ng/mL (ref 30–89)

## 2013-08-15 LAB — TSH: TSH: 2.223 u[IU]/mL (ref 0.350–4.500)

## 2013-08-17 ENCOUNTER — Other Ambulatory Visit: Payer: Self-pay | Admitting: Family Medicine

## 2013-08-17 DIAGNOSIS — M25559 Pain in unspecified hip: Secondary | ICD-10-CM

## 2013-09-01 ENCOUNTER — Ambulatory Visit
Admission: RE | Admit: 2013-09-01 | Discharge: 2013-09-01 | Disposition: A | Discharge status: Home / Self Care | Payer: Medicare HMO | Source: Ambulatory Visit | Attending: Medical | Admitting: Medical

## 2013-09-01 DIAGNOSIS — M25559 Pain in unspecified hip: Secondary | ICD-10-CM

## 2013-09-06 ENCOUNTER — Encounter: Payer: Self-pay | Admitting: Medical

## 2013-11-02 ENCOUNTER — Encounter: Payer: Self-pay | Admitting: Medical

## 2013-11-03 ENCOUNTER — Ambulatory Visit
Admission: RE | Admit: 2013-11-03 | Discharge: 2013-11-03 | Disposition: A | Discharge status: Home / Self Care | Payer: Medicare HMO | Source: Ambulatory Visit | Attending: Medical | Admitting: Medical

## 2013-11-03 ENCOUNTER — Encounter: Payer: Self-pay | Admitting: Medical

## 2013-11-03 ENCOUNTER — Ambulatory Visit (INDEPENDENT_AMBULATORY_CARE_PROVIDER_SITE_OTHER): Payer: Commercial Managed Care - HMO | Admitting: Medical

## 2013-11-03 VITALS — BP 132/82 | HR 80 | Temp 98.0°F | Resp 16 | Wt 159.0 lb

## 2013-11-03 DIAGNOSIS — R29898 Other symptoms and signs involving the musculoskeletal system: Secondary | ICD-10-CM

## 2013-11-03 DIAGNOSIS — M25559 Pain in unspecified hip: Secondary | ICD-10-CM

## 2013-11-03 DIAGNOSIS — M5410 Radiculopathy, site unspecified: Secondary | ICD-10-CM

## 2013-11-03 DIAGNOSIS — R200 Anesthesia of skin: Secondary | ICD-10-CM

## 2013-11-03 DIAGNOSIS — M549 Dorsalgia, unspecified: Secondary | ICD-10-CM

## 2013-11-03 DIAGNOSIS — IMO0002 Reserved for concepts with insufficient information to code with codable children: Secondary | ICD-10-CM

## 2013-11-03 DIAGNOSIS — R209 Unspecified disturbances of skin sensation: Secondary | ICD-10-CM

## 2013-11-03 NOTE — Progress Notes (Signed)
Subjective:   HPI  Emily Cowan is a 68 y.o. female who presents for recheck on back and leg issues.  Pain and weakness is worsening in right hip despite PT.   Has been doing physical therapy in [place] x 4-5 weeks, finished this recently.  Using recumbent bike daily, doing the HEP demonstrated by PT.  Flew to [place] for convention this past week, ended up walking miles.  Had lots of problems with pain and weakness last week.  Used cane, was tempted to use wheel chair due to the pain.  Today pain is ok level, but with walking gets significant pain in right hip mostly.  Sometimes gets pain in the right leg and back.  Feels weakness in right hip.  Hip feels like it will give out.   Described the weakness as afraid to stand or walk for fear of hip giving out.   Pain is described as sharp pain with walking.  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   She was seen at [facility] on 08/07/13 for sciatica, back pain, right leg numbness. The pain came on suddenly, severe enough to take her to the hospital. She was prescribed oxycodone, 5 mg, Flexeril 10 mg, and 40 mg of prednisone daily for 5 days. She was having right leg numbness along the front of her shin and great toe and top of her foot, has some change in gait.  Current not so much problems with the numbness.  She is worried something else is going on besides deconditioning or need to strength the hip muscles.   Reviewed their medical, surgical, family, social, medication, and allergy history and updated chart as appropriate.   Review of Systems Constitutional: -fever, -chills, -sweats, -unexpected weight change, -decreased appetite, -fatigue Dermatology: -changing moles, --rash, -lumps Cardiology: -chest pain, -palpitations, -swelling, -difficulty breathing when lying flat, -waking up short of breath Respiratory: -cough, -shortness of breath, -difficulty breathing with exercise or exertion, -wheezing, -coughing up blood Gastroenterology:  -abdominal pain, -nausea, -vomiting, -diarrhea, -constipation, -blood in stool, -changes in bowel movement, -difficulty swallowing or eating Hematology: -bleeding, -bruising  Musculoskeletal: +joint aches, -muscle aches, -joint swelling, +back pain, -neck pain, -cramping, -changes in gait Urology: -burning with urination, -difficulty urinating, -blood in urine, -urinary frequency, -urgency, -incontinence Neurology: -headache, -weakness, -tingling, -numbness, -memory loss, -falls, -dizziness    Past Medical History  Diagnosis Date  . Depression   . Osteoporosis   . Seborrheic dermatitis   . Cancer 2002    BREAST  . Allergic rhinitis   . Hearing impaired     wears hearing aids  . Wears glasses   . Alcohol abuse   . Family history of cancer     multiple  . Hemangioma of liver 2005    MRI   . Vulvodynia     longstanding, failed Premarin cream; can't tolerate pelvic exams   Past Surgical History  Procedure Laterality Date  . Mastectomy  2002    right  . Tonsillectomy    . Wisdom tooth extraction    . Colonoscopy  2011    in [place]     Objective:   Physical Exam  BP 132/82  Pulse 80  Temp(Src) 98 F (36.7 C) (Oral)  Resp 16  Wt 159 lb (72.122 kg).    General appearance: alert, no distress, WD/WN, white female, looks stated age, walking with a mild limp Skin: dry, scattered macules, freckles, no particular worrisome lesions Abdomen: +bs, soft, non tender, non distended, liver seems  enlarged, no masses, no splenomegaly, no bruits Back: Mild right-sided lumbar paraspinal tenderness, mild pain with back flexion and extension, otherwise non tender, relatively normal ROM, no scoliosis Musculoskeletal: Generalized pain of the right hip with range of motion although the range of motion seems relatively full, no obvious deformity, otherwise legs nontender, normal range of motion Extremities: no edema, no cyanosis, no clubbing Pulses: 1+ symmetric, upper and lower extremities,  normal cap refill Neurological: Right leg strength seems to be 4-5 out of 5 compared to the left in respect to size and and lower leg, but dorsi and plantar flexion strength seems normal the right leg, DTRs seem increased bilaterally in the legs, right leg sensation seems to be decreased at L5 distribution, left leg strength and sensation seems normal, heel and toe walk is normal, improved from last visit   Assessment and Plan :    Encounter Diagnoses  Name Primary?  . Hip pain Yes  . Back pain   . Radicular pain of right lower extremity   . Right leg weakness   . Right leg numbness    I have reviewed her symptoms, recent physical therapy notes, and she still has weakness on the right leg compared to the left, some decreased sensation of the right leg compared to the right, but in recent weeks the hip has been getting a lot of problems.   Her pain and radicular symptoms certainly improved some with physical therapy, however her symptoms today are focused more with the hip on the right.  Her exam findings still suggest radicular problems in the lumbar spine affecting the right leg, however can't rule out hip pathology. We will send her for x-rays of the right hip and pelvis today, and if these are normal we will probably move on to an MRI lumbar spine to further evaluate.  Follow-up pending xrays

## 2013-11-04 ENCOUNTER — Other Ambulatory Visit: Payer: Self-pay | Admitting: Family Medicine

## 2013-11-04 DIAGNOSIS — M25559 Pain in unspecified hip: Secondary | ICD-10-CM

## 2013-11-04 DIAGNOSIS — R29898 Other symptoms and signs involving the musculoskeletal system: Secondary | ICD-10-CM

## 2013-11-04 DIAGNOSIS — M549 Dorsalgia, unspecified: Secondary | ICD-10-CM

## 2013-11-04 DIAGNOSIS — IMO0002 Reserved for concepts with insufficient information to code with codable children: Secondary | ICD-10-CM

## 2013-11-10 ENCOUNTER — Inpatient Hospital Stay: Admission: RE | Admit: 2013-11-10 | Payer: Medicare HMO | Source: Ambulatory Visit

## 2013-11-13 ENCOUNTER — Ambulatory Visit
Admission: RE | Admit: 2013-11-13 | Discharge: 2013-11-13 | Disposition: A | Discharge status: Home / Self Care | Payer: Medicare HMO | Source: Ambulatory Visit | Attending: Medical | Admitting: Medical

## 2013-11-13 DIAGNOSIS — M549 Dorsalgia, unspecified: Secondary | ICD-10-CM

## 2013-11-13 DIAGNOSIS — M25559 Pain in unspecified hip: Secondary | ICD-10-CM

## 2013-11-13 DIAGNOSIS — R29898 Other symptoms and signs involving the musculoskeletal system: Secondary | ICD-10-CM

## 2013-11-13 DIAGNOSIS — IMO0002 Reserved for concepts with insufficient information to code with codable children: Secondary | ICD-10-CM

## 2013-11-19 ENCOUNTER — Ambulatory Visit (INDEPENDENT_AMBULATORY_CARE_PROVIDER_SITE_OTHER): Payer: Commercial Managed Care - HMO | Admitting: Medical

## 2013-11-19 ENCOUNTER — Encounter: Payer: Self-pay | Admitting: Medical

## 2013-11-19 VITALS — BP 110/60 | HR 68 | Temp 97.9°F | Resp 18 | Wt 157.0 lb

## 2013-11-19 DIAGNOSIS — IMO0002 Reserved for concepts with insufficient information to code with codable children: Secondary | ICD-10-CM

## 2013-11-19 DIAGNOSIS — M549 Dorsalgia, unspecified: Secondary | ICD-10-CM

## 2013-11-19 DIAGNOSIS — M5410 Radiculopathy, site unspecified: Secondary | ICD-10-CM

## 2013-11-19 DIAGNOSIS — M810 Age-related osteoporosis without current pathological fracture: Secondary | ICD-10-CM

## 2013-11-19 NOTE — Progress Notes (Signed)
Subjective:   HPI  Emily Cowan is a 68 y.o. female who presents for recheck on back and leg issues.  Still has pain and weakness is worsening in right hip despite PT.   Has been doing physical therapy in [place] x 4-5 weeks, finished this recently.  Using recumbent bike daily, doing the HEP demonstrated by PT. Sometimes use cane.  Sometimes gets pain in the right leg and back.  Feels weakness in right hip.  Hip feels like it will give out.   Described the weakness as afraid to stand or walk for fear of hip giving out.   Pain is described as sharp pain with walking.  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   She was seen at [facility] on 08/07/13 for sciatica, back pain, right leg numbness. The pain came on suddenly, severe enough to take her to the hospital. She was prescribed oxycodone, 5 mg, Flexeril 10 mg, and 40 mg of prednisone daily for 5 days. She was having right leg numbness along the front of her shin and great toe and top of her foot, has some change in gait.  Currently not so much problems with the numbness.  Per recently Bone Density scan, she has not started Fosamax.  Reviewed their medical, surgical, family, social, medication, and allergy history and updated chart as appropriate.   Review of Systems Constitutional: -fever, -chills, -sweats, -unexpected weight change, -decreased appetite, -fatigue Dermatology: -changing moles, --rash, -lumps Cardiology: -chest pain, -palpitations, -swelling, -difficulty breathing when lying flat, -waking up short of breath Respiratory: -cough, -shortness of breath, -difficulty breathing with exercise or exertion, -wheezing, -coughing up blood Gastroenterology: -abdominal pain, -nausea, -vomiting, -diarrhea, -constipation, -blood in stool, -changes in bowel movement, -difficulty swallowing or eating Hematology: -bleeding, -bruising  Musculoskeletal: +joint aches, -muscle aches, -joint swelling, +back pain, -neck pain, -cramping, -changes  in gait Urology: -burning with urination, -difficulty urinating, -blood in urine, -urinary frequency, -urgency, -incontinence Neurology: -headache, -weakness, -tingling, -numbness, -memory loss, -falls, -dizziness    Past Medical History  Diagnosis Date  . Depression   . Osteoporosis   . Seborrheic dermatitis   . Cancer 2002    BREAST  . Allergic rhinitis   . Hearing impaired     wears hearing aids  . Wears glasses   . Alcohol abuse   . Family history of cancer     multiple  . Hemangioma of liver 2005    MRI   . Vulvodynia     longstanding, failed Premarin cream; can't tolerate pelvic exams   Past Surgical History  Procedure Laterality Date  . Mastectomy  2002    right  . Tonsillectomy    . Wisdom tooth extraction    . Colonoscopy  2011    in [place]     Objective:   Physical Exam  BP 110/60  Pulse 68  Temp(Src) 97.9 F (36.6 C) (Oral)  Resp 18  Wt 157 lb (71.215 kg).    General appearance: alert, no distress, WD/WN, white female, looks stated age, walking with a mild limp Abdomen: +bs, soft, non tender, non distended, liver seems enlarged, no masses, no splenomegaly, no bruits Back: Mild right-sided lumbar paraspinal tenderness, mild pain with back flexion and extension, otherwise non tender, relatively normal ROM, no scoliosis Musculoskeletal: Generalized pain of the right hip with range of motion although the range of motion seems relatively full, no obvious deformity, otherwise legs nontender, normal range of motion Extremities: no edema, no cyanosis, no clubbing Pulses:  1+ symmetric, upper and lower extremities, normal cap refill Neurological: Right leg strength seems to be 4-5 out of 5 compared to the left in respect to size and and lower leg, but dorsi and plantar flexion strength seems normal the right leg, DTRs seem increased bilaterally in the legs, right leg sensation seems to be decreased at L5 distribution, left leg strength and sensation seems  normal, heel and toe walk is normal, improved from last visit   Assessment and Plan :    Encounter Diagnoses  Name Primary?  . Radicular pain of right lower extremity Yes  . Back pain   . Osteoporosis, unspecified    He her recent MRI L. spine, discussed the findings, discussed possible treatment options for her radicular symptoms including epidural steroid injection, medication, NSAIDs, neuropathic pain medications, ongoing home exercise program. She has completed physical therapy at this time.  She agrees to referral for epidural steroid injection which we will do through interventional radiology   She asked me to forward a note about her blood count MRI to Dr. Beryle Beams her oncologist although she has been released from care due to cancer in remission  Osteoporosis-she has not started Fosamax, I went back over treatment recommendations, reviewed briefly back over her bone density scan, and she will go ahead and start Fosamax and continue calcium with vitamin D

## 2013-11-20 ENCOUNTER — Telehealth: Payer: Self-pay | Admitting: Family Medicine

## 2013-11-20 NOTE — Telephone Encounter (Signed)
Message copied by Armanda Magic on Fri Nov 20, 2013 12:01 PM ------      Message from: Carlena Hurl      Created: Thu Nov 19, 2013 10:54 AM       Refer to [facility] for epidural steroid injection right lumbar region for back pain, radicular right leg pain, hip pain.   Had recent MRI there for L spine.            Needs mid morning appt, not tomorrow. ------

## 2013-11-20 NOTE — Telephone Encounter (Signed)
I FAX HER ORDER OVER TO [facility] AND THEY WILL CONTACT HER TO SET UP THE STERIOD INJECTION. CLS [phone]

## 2013-11-20 NOTE — Telephone Encounter (Signed)
Message copied by Armanda Magic on Fri Nov 20, 2013 12:04 PM ------      Message from: Carlena Hurl      Created: Thu Nov 19, 2013 12:30 PM       Please call her and let her know that Dr. Beryle Beams did look over the recent tests and sees nothing worrisome.  He asked me to call her and let her know.      Emily Cowan      ----- Message -----         From: Annia Belt, MD         Sent: 11/19/2013  11:13 AM           To: Carlena Hurl, PA-C            Her CBC is normal and the MRI findings are non specific and do not suggest relapseof hercancer  [place]  So if you could please call her for me I would appreciate it      Algis Greenhouse      ----- Message -----         From: Carlena Hurl, PA-C         Sent: 11/19/2013  10:48 AM           To: Annia Belt, MD            Hello Dr. Beryle Beams            I saw [name] today.  You have released her from care for breast cancer, in remission.   I saw her recently, and she asked if I would let you know about her updated blood count and MRI L spine recently.  She requested if you would be kind enough to look at the CBC and MRI in light of her history of breast cancer to keep you in the loop.              She has been having right sided radicular pain which led to this eval.            I told her that I would pass the message along.            Thanks      Emily Cowan             ------

## 2013-11-20 NOTE — Telephone Encounter (Signed)
Patient is aware of the message from [facility] and her referral. CLS

## 2013-11-24 ENCOUNTER — Encounter: Payer: Self-pay | Admitting: Medical

## 2013-11-24 ENCOUNTER — Telehealth: Payer: Self-pay | Admitting: Family Medicine

## 2013-11-24 NOTE — Telephone Encounter (Signed)
Message copied by Armanda Magic on Tue Nov 24, 2013  2:04 PM ------      Message from: Carlena Hurl      Created: Tue Nov 24, 2013  1:27 PM       Did I send you message.  She needs to be referred to [facility] interventional radiology for epidural steroid injection Lumbar spine ASAP!!! ------

## 2013-11-24 NOTE — Telephone Encounter (Signed)
I spoke with [facility] and they told me that they got my order and that they are waiting on prior approval from [facility] and it can take up to 5 days. I called Ms. Lenig and made her aware of this. CLS

## 2013-11-26 ENCOUNTER — Encounter: Payer: Self-pay | Admitting: Medical

## 2013-12-02 ENCOUNTER — Encounter: Payer: Self-pay | Admitting: Medical

## 2013-12-02 ENCOUNTER — Other Ambulatory Visit: Payer: Self-pay | Admitting: Medical

## 2013-12-02 DIAGNOSIS — M549 Dorsalgia, unspecified: Secondary | ICD-10-CM

## 2013-12-02 DIAGNOSIS — M79604 Pain in right leg: Secondary | ICD-10-CM

## 2013-12-03 NOTE — Telephone Encounter (Signed)
Called [facility] and left message that Emily Cowan wants to cancel her Interventional Radiology appt. Left my phone number if they have questions. WL

## 2013-12-07 ENCOUNTER — Other Ambulatory Visit: Payer: Medicare HMO

## 2014-03-18 ENCOUNTER — Other Ambulatory Visit: Payer: Self-pay | Admitting: Family Medicine

## 2014-03-18 NOTE — Telephone Encounter (Signed)
Is this okay to call in? 

## 2014-03-19 ENCOUNTER — Telehealth: Payer: Self-pay | Admitting: Family Medicine

## 2014-03-19 NOTE — Telephone Encounter (Signed)
Message copied by Armanda Magic on Fri Mar 19, 2014 10:48 AM ------      Message from: Carlena Hurl      Created: Thu Mar 18, 2014  8:46 PM       Call out [facility] with 2 refills ------

## 2014-03-19 NOTE — Telephone Encounter (Signed)
I called out Ambien to her pharmacy per Chana Bode PAC. CLS

## 2014-04-07 ENCOUNTER — Encounter: Payer: Self-pay | Admitting: Medical

## 2014-04-07 ENCOUNTER — Other Ambulatory Visit: Payer: Self-pay | Admitting: Family Medicine

## 2014-04-07 ENCOUNTER — Telehealth: Payer: Self-pay | Admitting: Medical

## 2014-04-07 ENCOUNTER — Ambulatory Visit (INDEPENDENT_AMBULATORY_CARE_PROVIDER_SITE_OTHER): Payer: Commercial Managed Care - HMO | Admitting: Medical

## 2014-04-07 VITALS — BP 98/70 | HR 60 | Temp 98.0°F | Resp 14 | Wt 152.0 lb

## 2014-04-07 DIAGNOSIS — H269 Unspecified cataract: Secondary | ICD-10-CM

## 2014-04-07 DIAGNOSIS — R229 Localized swelling, mass and lump, unspecified: Secondary | ICD-10-CM

## 2014-04-07 DIAGNOSIS — R2241 Localized swelling, mass and lump, right lower limb: Secondary | ICD-10-CM

## 2014-04-07 DIAGNOSIS — N644 Mastodynia: Secondary | ICD-10-CM

## 2014-04-07 DIAGNOSIS — H538 Other visual disturbances: Secondary | ICD-10-CM

## 2014-04-07 LAB — POCT URINALYSIS DIPSTICK
Bilirubin, UA: NEGATIVE
Glucose, UA: NEGATIVE
Ketones, UA: NEGATIVE
LEUKOCYTES UA: NEGATIVE
Nitrite, UA: NEGATIVE
PROTEIN UA: NEGATIVE
RBC UA: NEGATIVE
Spec Grav, UA: 1.025
Urobilinogen, UA: NEGATIVE
pH, UA: 5

## 2014-04-07 LAB — GLUCOSE, POCT (MANUAL RESULT ENTRY): POC Glucose: 90 mg/dl (ref 70–99)

## 2014-04-07 NOTE — Telephone Encounter (Signed)
Refer for soft tissue ultrasound of right buttock lump, likely a lipoma

## 2014-04-07 NOTE — Progress Notes (Signed)
Subjective: Here for 3 concerns  She has history of right-sided sciatica leg and buttock pain, still has a little limp due to pain in the side.  She is here because recently she noticed a lump or mass behind the right hip. Not sure how long this has been there but likely 2 weeks or more. It is somewhat discomforting. Denies redness, swelling, fever. The lump doesn't seem to interfere with motion.  She has a history of cataract in left eye, new glasses April with a vision of 20/20.  However in the last 2 days her vision seems more blurry on the left. Denies vision loss. Denies changes in the field of vision. She denies any other weakness, numbness, tingling, no speech changes, no confusion or headaches.  She has been more active around the house, doing some weight lifting as part of her exercise regimen.  In recent weeks she feels like the muscle toning and the chest is putting pressure on her right breast implant causing discomfort  ROS as in subjective  Past Medical History  Diagnosis Date  . Depression   . Osteoporosis   . Seborrheic dermatitis   . Cancer 2002    BREAST  . Allergic rhinitis   . Hearing impaired     wears hearing aids  . Wears glasses   . Alcohol abuse   . Family history of cancer     multiple  . Hemangioma of liver 2005    MRI   . Vulvodynia     longstanding, failed Premarin cream; can't tolerate pelvic exams   Past Surgical History  Procedure Laterality Date  . Mastectomy  2002    right  . Tonsillectomy    . Wisdom tooth extraction    . Colonoscopy  2011    in [place]   Objective: Gen: wd, wn, nad Skin: unremarkable Eye: PERRLA, EOMi, visual fields normal, handheld close vision 20/20 corrected bilat, see distant vision test results, 20/70 left, there is a grayish light reflex suggestive of cartaract bilat, otherwise funduscopic exam unremarkable Neuro: CN 2-12 intact, nonfocal exam MSK: right buttock over sciatic notch with a 7cm oval mass that is  spongy.  When patient is bent over, it is more palpable, when she stands, the mass seems to move medically somewhat.  There is no induration, fluctuance,e warmth, or erythema.  lesion suggestive of lipoma, otherwise MSK UE and LE unremarkable Ext: no edema Pulses normal Breast not examined   Assessment: Encounter Diagnoses  Name Primary?  . Blurred vision, left eye Yes  . Cataract   . Lump of skin of lower extremity, right   . Breast pain, right    Plan: We discussed her exam and concerns  For her for ultrasound for likely lipoma of right buttock  She will followup with the eye doctor given that her blood sugar and urinalysis was normal, neurological exam normal, and I suspect the cataract is the culprit  Gave recommendations regarding breast discomfort  Specific recommendations today include:  Followup with your eye doctor regarding the blurred vision  We will set you up for soft tissue ultrasound to further evaluate the lump of the right buttock which is likely a lipoma  Breast pain- use stretching daily along with your exercise routine.  If this discomfort continues or if desired we can refer you to massage therapy or physical therapy to help with this discomfort  Pending referral

## 2014-04-07 NOTE — Patient Instructions (Signed)
  Thank you for giving me the opportunity to serve you today.    Your diagnosis today includes: Encounter Diagnoses  Name Primary?  . Blurred vision, left eye Yes  . Cataract   . Lump of skin of lower extremity, right   . Breast pain, right      Specific recommendations today include:  Followup with your eye doctor regarding the blurred vision  We will set you up for soft tissue ultrasound to further evaluate the lump of the right buttock which is likely a lipoma  Breast pain- use stretching daily along with your exercise routine.  If this discomfort continues or if desired we can refer you to massage therapy or physical therapy to help with this discomfort  Return pending ultrasound.    I have included other useful information below for your review.    Lipoma A lipoma is a noncancerous (benign) tumor composed of fat cells. They are usually found under the skin (subcutaneous). A lipoma may occur in any tissue of the body that contains fat. Common areas for lipomas to appear include the back, shoulders, buttocks, and thighs. Lipomas are a very common soft tissue growth. They are soft and grow slowly. Most problems caused by a lipoma depend on where it is growing. DIAGNOSIS  A lipoma can be diagnosed with a physical exam. These tumors rarely become cancerous, but radiographic studies can help determine this for certain. Studies used may include:  Computerized X-ray scans (CT or CAT scan).  Computerized magnetic scans (MRI). TREATMENT  Small lipomas that are not causing problems may be watched. If a lipoma continues to enlarge or causes problems, removal is often the best treatment. Lipomas can also be removed to improve appearance. Surgery is done to remove the fatty cells and the surrounding capsule. Most often, this is done with medicine that numbs the area (local anesthetic). The removed tissue is examined under a microscope to make sure it is not cancerous. Keep all follow-up  appointments with your caregiver. SEEK MEDICAL CARE IF:   The lipoma becomes larger or hard.  The lipoma becomes painful, red, or increasingly swollen. These could be signs of infection or a more serious condition. Document Released: 08/03/2002 Document Revised: 11/05/2011 Document Reviewed: 01/13/2010 [facility] Patient Information 2015 [place], [place]. This information is not intended to replace advice given to you by your health care provider. Make sure you discuss any questions you have with your health care provider.

## 2014-04-07 NOTE — Telephone Encounter (Signed)
Patient is aware of her ultrasound appointment on 04/09/14 @ 1045 am. CLS

## 2014-04-09 ENCOUNTER — Ambulatory Visit
Admission: RE | Admit: 2014-04-09 | Discharge: 2014-04-09 | Disposition: A | Discharge status: Home / Self Care | Payer: Commercial Managed Care - HMO | Source: Ambulatory Visit | Attending: Medical | Admitting: Medical

## 2014-04-09 DIAGNOSIS — R229 Localized swelling, mass and lump, unspecified: Secondary | ICD-10-CM

## 2014-06-04 ENCOUNTER — Other Ambulatory Visit: Payer: Self-pay | Admitting: Medical

## 2014-06-04 ENCOUNTER — Telehealth: Payer: Self-pay | Admitting: Internal Medicine

## 2014-06-04 DIAGNOSIS — F329 Major depressive disorder, single episode, unspecified: Secondary | ICD-10-CM

## 2014-06-04 DIAGNOSIS — F32A Depression, unspecified: Secondary | ICD-10-CM

## 2014-06-04 MED ORDER — FLUOXETINE HCL 40 MG PO CAPS: 40.0000 mg | ORAL_CAPSULE | Freq: Every day | ORAL | Status: DC

## 2014-06-04 NOTE — Telephone Encounter (Signed)
F/u in December for physical.  Med sent

## 2014-06-04 NOTE — Telephone Encounter (Signed)
Patient is aware of medication sent and to schedule physical in december

## 2014-06-04 NOTE — Telephone Encounter (Signed)
Refill request for fluoxetine 40mg  #90 to [facility]

## 2014-08-17 ENCOUNTER — Encounter: Payer: Self-pay | Admitting: Medical

## 2014-08-17 ENCOUNTER — Ambulatory Visit (INDEPENDENT_AMBULATORY_CARE_PROVIDER_SITE_OTHER): Payer: Commercial Managed Care - HMO | Admitting: Medical

## 2014-08-17 VITALS — BP 110/80 | HR 74 | Temp 98.2°F | Resp 14 | Ht 67.0 in | Wt 153.0 lb

## 2014-08-17 DIAGNOSIS — Z853 Personal history of malignant neoplasm of breast: Secondary | ICD-10-CM

## 2014-08-17 DIAGNOSIS — Z129 Encounter for screening for malignant neoplasm, site unspecified: Secondary | ICD-10-CM

## 2014-08-17 DIAGNOSIS — F329 Major depressive disorder, single episode, unspecified: Secondary | ICD-10-CM

## 2014-08-17 DIAGNOSIS — G4700 Insomnia, unspecified: Secondary | ICD-10-CM | POA: Insufficient documentation

## 2014-08-17 DIAGNOSIS — Z1211 Encounter for screening for malignant neoplasm of colon: Secondary | ICD-10-CM

## 2014-08-17 DIAGNOSIS — N94819 Vulvodynia, unspecified: Secondary | ICD-10-CM | POA: Insufficient documentation

## 2014-08-17 DIAGNOSIS — Z79899 Other long term (current) drug therapy: Secondary | ICD-10-CM

## 2014-08-17 DIAGNOSIS — F32A Depression, unspecified: Secondary | ICD-10-CM

## 2014-08-17 DIAGNOSIS — M810 Age-related osteoporosis without current pathological fracture: Secondary | ICD-10-CM

## 2014-08-17 DIAGNOSIS — Z23 Encounter for immunization: Secondary | ICD-10-CM

## 2014-08-17 DIAGNOSIS — Z1239 Encounter for other screening for malignant neoplasm of breast: Secondary | ICD-10-CM

## 2014-08-17 LAB — CBC
HEMATOCRIT: 42.1 % (ref 36.0–46.0)
HEMOGLOBIN: 13.6 g/dL (ref 12.0–15.0)
MCH: 31.1 pg (ref 26.0–34.0)
MCHC: 32.3 g/dL (ref 30.0–36.0)
MCV: 96.3 fL (ref 78.0–100.0)
MPV: 9.4 fL (ref 9.4–12.4)
Platelets: 313 10*3/uL (ref 150–400)
RBC: 4.37 MIL/uL (ref 3.87–5.11)
RDW: 13.9 % (ref 11.5–15.5)
WBC: 5.2 10*3/uL (ref 4.0–10.5)

## 2014-08-17 LAB — COMPREHENSIVE METABOLIC PANEL
ALT: 25 U/L (ref 0–35)
AST: 18 U/L (ref 0–37)
Albumin: 4.2 g/dL (ref 3.5–5.2)
Alkaline Phosphatase: 85 U/L (ref 39–117)
BILIRUBIN TOTAL: 0.4 mg/dL (ref 0.2–1.2)
BUN: 13 mg/dL (ref 6–23)
CALCIUM: 9.3 mg/dL (ref 8.4–10.5)
CO2: 26 mEq/L (ref 19–32)
CREATININE: 0.69 mg/dL (ref 0.50–1.10)
Chloride: 104 mEq/L (ref 96–112)
GLUCOSE: 84 mg/dL (ref 70–99)
Potassium: 4.6 mEq/L (ref 3.5–5.3)
Sodium: 137 mEq/L (ref 135–145)
Total Protein: 6.6 g/dL (ref 6.0–8.3)

## 2014-08-17 MED ORDER — ZOLPIDEM TARTRATE 5 MG PO TABS: 5.0000 mg | ORAL_TABLET | Freq: Every evening | ORAL | Status: DC | PRN

## 2014-08-17 MED ORDER — FEXOFENADINE HCL 180 MG PO TABS: 180.0000 mg | ORAL_TABLET | Freq: Every day | ORAL | Status: DC

## 2014-08-17 MED ORDER — ALENDRONATE SODIUM 70 MG PO TABS: 70.0000 mg | ORAL_TABLET | ORAL | Status: DC

## 2014-08-17 MED ORDER — FLUOXETINE HCL 40 MG PO CAPS: 40.0000 mg | ORAL_CAPSULE | Freq: Every day | ORAL | Status: DC

## 2014-08-17 NOTE — Progress Notes (Signed)
Subjective: She is here today for med check  Osteoporosis-she has been bad about remembering to take medications regularly so she is not currently taken Fosamax but is taking vitamin D regularly  Insomnia-does fine on zolpidem when necessary  Depression-takes Prozac daily, no concerns. Since last visit she finally sold her house and moved into a small 2 room cabin that she had built on 2 acres.  She is much happier here with privacy, acreage, quite, low maintenance, and this allows her to work out in the yard daily year round.  She has no other new concerns.  ROS as in subjective  Past Medical History  Diagnosis Date  . Depression   . Osteoporosis   . Seborrheic dermatitis   . Cancer 2002    BREAST  . Allergic rhinitis   . Hearing impaired     wears hearing aids  . Wears glasses   . Alcohol abuse   . Family history of cancer     multiple  . Hemangioma of liver 2005    MRI   . Vulvodynia     longstanding, failed Premarin cream; can't tolerate pelvic exams    Past Surgical History  Procedure Laterality Date  . Mastectomy  2002    right  . Tonsillectomy    . Wisdom tooth extraction    . Colonoscopy  2011    in [facility]    History   Social History  . Marital Status: Single    Spouse Name: N/A    Number of Children: N/A  . Years of Education: N/A   Occupational History  . Not on file.   Social History Main Topics  . Smoking status: Never Smoker   . Smokeless tobacco: Never Used  . Alcohol Use: 8.4 oz/week    14 Glasses of wine per week  . Drug Use: No  . Sexual Activity: Not Currently   Other Topics Concern  . Not on file   Social History Narrative   Single, no children, retired, lesbian but no current relationship; exercise- working in the yard, cutting down trees, Biomedical scientist, walking the dogs.  Retired Therapist, music.  [place], considering move to something smaller, more manageable as of 12/14.    Family History  Problem Relation Age  of Onset  . Cancer Mother     GI cancer  . Cancer Father     liver  . Stroke Father   . Cancer Sister     vulvar  . Cancer Brother     liver  . Diabetes Neg Hx   . Heart disease Neg Hx   . Hypertension Neg Hx   . Hyperlipidemia Neg Hx   . Cancer Brother     lung    Current outpatient prescriptions: aspirin 81 MG tablet, Take 1 tablet (81 mg total) by mouth daily., Disp: 90 tablet, Rfl: 4;  calcium carbonate (OS-CAL) 600 MG TABS tablet, Take 600 mg by mouth daily with breakfast., Disp: , Rfl: ;  fexofenadine (ALLEGRA) 180 MG tablet, Take 1 tablet (180 mg total) by mouth daily., Disp: 90 tablet, Rfl: 3;  FLUoxetine (PROZAC) 40 MG capsule, Take 1 capsule (40 mg total) by mouth daily., Disp: 90 capsule, Rfl: 1 Glucosamine-Chondroit-Vit C-Mn (GLUCOSAMINE CHONDR 1500 COMPLX) CAPS, Take 1 capsule by mouth daily.  , Disp: , Rfl: ;  Multiple Vitamins-Minerals (MULTIVITAMIN WITH MINERALS) tablet, Take 1 tablet by mouth daily.  , Disp: , Rfl: ;  zolpidem (AMBIEN) 5 MG tablet, Take 1  tablet (5 mg total) by mouth at bedtime as needed. for sleep, Disp: 90 tablet, Rfl: 1 alendronate (FOSAMAX) 70 MG tablet, Take 1 tablet (70 mg total) by mouth every 7 (seven) days. Take with a full glass of water on an empty stomach., Disp: 13 tablet, Rfl: 3;  ibuprofen (ADVIL,MOTRIN) 100 MG chewable tablet, Chew 100 mg by mouth every 8 (eight) hours as needed.  , Disp: , Rfl:   No Known Allergies   Objective: BP 110/80 mmHg  Pulse 74  Temp(Src) 98.2 F (36.8 C) (Oral)  Resp 14  Ht 5\' 7"  (1.702 m)  Wt 153 lb (69.4 kg)  BMI 23.96 kg/m2  General appearance: alert, no distress, WD/WN, white female Skin: dry, scattered macules, freckles, no particular worrisome lesions Neck: supple, no lymphadenopathy, no thyromegaly, no masses, normal ROM, no bruits Chest: non tender, normal shape and expansion Heart: RRR, normal S1, S2, no murmurs Lungs: CTA bilaterally, no wheezes, rhonchi, or rales Abdomen: +bs, soft, non  tender, non distended, no masses, no hepatosplenomegaly, no bruits Back: non tender, normal ROM, no scoliosis Musculoskeletal: upper extremities non tender, no obvious deformity, normal ROM throughout, lower extremities non tender, no obvious deformity, normal ROM throughout Extremities: no edema, no cyanosis, no clubbing Pulses: 1+ symmetric, upper and lower extremities, normal cap refill Neurological: alert, oriented x 3, CN2-12 intact, strength normal upper extremities and lower extremities, sensation normal throughout, DTRs 2+ throughout, no cerebellar signs, gait normal Psychiatric: normal affect, behavior normal, pleasant  Breast/gyn/rectal - declined    Assessment: Encounter Diagnoses  Name Primary?  . Osteoporosis Yes  . Depression   . Insomnia   . High risk medication use   . Screening for cancer   . Need for prophylactic vaccination and inoculation against influenza   . Need for prophylactic vaccination against Streptococcus pneumoniae (pneumococcus)   . Special screening for malignant neoplasms, colon   . Screening for breast cancer   . History of breast cancer   . Vulvodynia    Plan: Osteoporosis-discussed prior bone density findings, restart Fosamax, discussed proper use of the medication, continue vitamin D daily over-the-counter Depression-does fine on Prozac daily.  Glad to hear about her new cabin Insomnia-does fine on current medication Labs today Counseled on the influenza virus vaccine.  Vaccine information sheet given.  Influenza vaccine given after consent obtained. Counseled on the pneumococcal vaccine.  Vaccine information sheet given.  Pneumococcal Prevnar 13 vaccine given after consent obtained. See your eye doctor yearly for routine vision care. See your dentist yearly for routine dental care including hygiene visits twice yearly. Advised that she is due for mammogram and colonoscopy as of 08/2014.   She will let me know when ready for referrals.  She  will check insurance first.  Vulvodynia - CA125 at her request.   She declines other evaluation at this time.  She has long hx/o pain on speculum exam or insertion.   We tried to do limited pelvic ultrasound last year that was noncontributory for screening or evaluation.

## 2014-08-18 ENCOUNTER — Other Ambulatory Visit: Payer: Self-pay | Admitting: Medical

## 2014-08-18 DIAGNOSIS — E559 Vitamin D deficiency, unspecified: Secondary | ICD-10-CM

## 2014-08-18 LAB — VITAMIN D 25 HYDROXY (VIT D DEFICIENCY, FRACTURES): Vit D, 25-Hydroxy: 28 ng/mL — ABNORMAL LOW (ref 30–100)

## 2014-08-18 LAB — CA 125: CA 125: 4 U/mL (ref <35)

## 2014-08-18 MED ORDER — VITAMIN D (ERGOCALCIFEROL) 1.25 MG (50000 UNIT) PO CAPS: 50000.0000 [IU] | ORAL_CAPSULE | ORAL | Status: DC

## 2014-08-19 ENCOUNTER — Telehealth: Payer: Self-pay | Admitting: Medical

## 2014-08-19 NOTE — Telephone Encounter (Signed)
Rcvd a fax requesting a manual signature for the new script of Zolpidem 5MG  #90. Placed form in Emily Cowan's folder for the manual signature so that the form can be faxed back.

## 2014-08-23 NOTE — Telephone Encounter (Signed)
See script.

## 2014-08-24 NOTE — Telephone Encounter (Signed)
Faxed form on 08/23/14

## 2014-11-08 DIAGNOSIS — H5210 Myopia, unspecified eye: Secondary | ICD-10-CM | POA: Diagnosis not present

## 2014-11-08 DIAGNOSIS — H524 Presbyopia: Secondary | ICD-10-CM | POA: Diagnosis not present

## 2014-11-11 ENCOUNTER — Encounter: Payer: Self-pay | Admitting: Medical

## 2014-11-19 ENCOUNTER — Encounter: Payer: Self-pay | Admitting: Medical

## 2014-11-26 ENCOUNTER — Encounter: Payer: Self-pay | Admitting: Internal Medicine

## 2014-11-26 ENCOUNTER — Other Ambulatory Visit: Payer: Commercial Managed Care - HMO

## 2014-11-26 DIAGNOSIS — Z1231 Encounter for screening mammogram for malignant neoplasm of breast: Secondary | ICD-10-CM | POA: Diagnosis not present

## 2014-11-26 DIAGNOSIS — E559 Vitamin D deficiency, unspecified: Secondary | ICD-10-CM | POA: Diagnosis not present

## 2014-11-26 DIAGNOSIS — Z853 Personal history of malignant neoplasm of breast: Secondary | ICD-10-CM | POA: Diagnosis not present

## 2014-11-26 LAB — HM MAMMOGRAPHY

## 2014-11-27 LAB — VITAMIN D 25 HYDROXY (VIT D DEFICIENCY, FRACTURES): Vit D, 25-Hydroxy: 27 ng/mL — ABNORMAL LOW (ref 30–100)

## 2014-11-29 ENCOUNTER — Other Ambulatory Visit: Payer: Self-pay | Admitting: Medical

## 2014-11-29 MED ORDER — VITAMIN D (ERGOCALCIFEROL) 1.25 MG (50000 UNIT) PO CAPS: 50000.0000 [IU] | ORAL_CAPSULE | ORAL | Status: DC

## 2015-01-05 ENCOUNTER — Encounter: Payer: Self-pay | Admitting: Medical

## 2015-01-21 DIAGNOSIS — H25812 Combined forms of age-related cataract, left eye: Secondary | ICD-10-CM | POA: Diagnosis not present

## 2015-02-08 DIAGNOSIS — Z79899 Other long term (current) drug therapy: Secondary | ICD-10-CM | POA: Diagnosis not present

## 2015-02-08 DIAGNOSIS — Z87891 Personal history of nicotine dependence: Secondary | ICD-10-CM | POA: Diagnosis not present

## 2015-02-08 DIAGNOSIS — H259 Unspecified age-related cataract: Secondary | ICD-10-CM | POA: Diagnosis not present

## 2015-02-08 DIAGNOSIS — H25812 Combined forms of age-related cataract, left eye: Secondary | ICD-10-CM | POA: Diagnosis not present

## 2015-02-16 ENCOUNTER — Telehealth: Payer: Self-pay | Admitting: Medical

## 2015-02-16 NOTE — Telephone Encounter (Signed)
Initiated P.A. Ambien

## 2015-02-18 ENCOUNTER — Telehealth: Payer: Self-pay | Admitting: Medical

## 2015-02-18 NOTE — Telephone Encounter (Signed)
P.A. ZOLPIDEM approved til 02/17/16, Left message for pt

## 2015-03-09 ENCOUNTER — Encounter: Payer: Self-pay | Admitting: Medical

## 2015-03-09 ENCOUNTER — Other Ambulatory Visit: Payer: Self-pay | Admitting: Medical

## 2015-03-22 DIAGNOSIS — Z79899 Other long term (current) drug therapy: Secondary | ICD-10-CM | POA: Diagnosis not present

## 2015-03-22 DIAGNOSIS — Z7982 Long term (current) use of aspirin: Secondary | ICD-10-CM | POA: Diagnosis not present

## 2015-03-22 DIAGNOSIS — H259 Unspecified age-related cataract: Secondary | ICD-10-CM | POA: Diagnosis not present

## 2015-03-22 DIAGNOSIS — J309 Allergic rhinitis, unspecified: Secondary | ICD-10-CM | POA: Diagnosis not present

## 2015-03-22 DIAGNOSIS — Z87891 Personal history of nicotine dependence: Secondary | ICD-10-CM | POA: Diagnosis not present

## 2015-03-22 DIAGNOSIS — H25811 Combined forms of age-related cataract, right eye: Secondary | ICD-10-CM | POA: Diagnosis not present

## 2015-03-22 DIAGNOSIS — H2589 Other age-related cataract: Secondary | ICD-10-CM | POA: Diagnosis not present

## 2015-03-22 DIAGNOSIS — H9190 Unspecified hearing loss, unspecified ear: Secondary | ICD-10-CM | POA: Diagnosis not present

## 2015-05-09 ENCOUNTER — Encounter: Payer: Self-pay | Admitting: Medical

## 2015-06-01 ENCOUNTER — Encounter: Payer: Self-pay | Admitting: Medical

## 2015-06-01 ENCOUNTER — Ambulatory Visit (INDEPENDENT_AMBULATORY_CARE_PROVIDER_SITE_OTHER): Payer: Commercial Managed Care - HMO | Admitting: Medical

## 2015-06-01 VITALS — BP 110/72 | HR 56 | Temp 97.6°F | Ht 66.5 in | Wt 153.0 lb

## 2015-06-01 DIAGNOSIS — Z13220 Encounter for screening for lipoid disorders: Secondary | ICD-10-CM

## 2015-06-01 DIAGNOSIS — L989 Disorder of the skin and subcutaneous tissue, unspecified: Secondary | ICD-10-CM | POA: Diagnosis not present

## 2015-06-01 DIAGNOSIS — M5030 Other cervical disc degeneration, unspecified cervical region: Secondary | ICD-10-CM | POA: Diagnosis not present

## 2015-06-01 DIAGNOSIS — M5417 Radiculopathy, lumbosacral region: Secondary | ICD-10-CM

## 2015-06-01 DIAGNOSIS — Z0000 Encounter for general adult medical examination without abnormal findings: Secondary | ICD-10-CM

## 2015-06-01 DIAGNOSIS — Z23 Encounter for immunization: Secondary | ICD-10-CM

## 2015-06-01 DIAGNOSIS — J301 Allergic rhinitis due to pollen: Secondary | ICD-10-CM

## 2015-06-01 DIAGNOSIS — F1010 Alcohol abuse, uncomplicated: Secondary | ICD-10-CM | POA: Diagnosis not present

## 2015-06-01 DIAGNOSIS — M542 Cervicalgia: Secondary | ICD-10-CM

## 2015-06-01 DIAGNOSIS — F341 Dysthymic disorder: Secondary | ICD-10-CM

## 2015-06-01 DIAGNOSIS — Z853 Personal history of malignant neoplasm of breast: Secondary | ICD-10-CM | POA: Diagnosis not present

## 2015-06-01 DIAGNOSIS — M5416 Radiculopathy, lumbar region: Secondary | ICD-10-CM | POA: Insufficient documentation

## 2015-06-01 DIAGNOSIS — G4700 Insomnia, unspecified: Secondary | ICD-10-CM

## 2015-06-01 DIAGNOSIS — N94819 Vulvodynia, unspecified: Secondary | ICD-10-CM | POA: Diagnosis not present

## 2015-06-01 DIAGNOSIS — G8929 Other chronic pain: Secondary | ICD-10-CM

## 2015-06-01 DIAGNOSIS — F1011 Alcohol abuse, in remission: Secondary | ICD-10-CM | POA: Insufficient documentation

## 2015-06-01 DIAGNOSIS — M810 Age-related osteoporosis without current pathological fracture: Secondary | ICD-10-CM

## 2015-06-01 LAB — CBC
HEMATOCRIT: 39.8 % (ref 36.0–46.0)
Hemoglobin: 13.6 g/dL (ref 12.0–15.0)
MCH: 31.8 pg (ref 26.0–34.0)
MCHC: 34.2 g/dL (ref 30.0–36.0)
MCV: 93 fL (ref 78.0–100.0)
MPV: 9.5 fL (ref 8.6–12.4)
Platelets: 288 10*3/uL (ref 150–400)
RBC: 4.28 MIL/uL (ref 3.87–5.11)
RDW: 13.4 % (ref 11.5–15.5)
WBC: 4.9 10*3/uL (ref 4.0–10.5)

## 2015-06-01 LAB — LIPID PANEL
Cholesterol: 203 mg/dL — ABNORMAL HIGH (ref 125–200)
HDL: 54 mg/dL (ref >=46)
LDL Cholesterol: 131 mg/dL — ABNORMAL HIGH (ref <130)
Total CHOL/HDL Ratio: 3.8 Ratio (ref <=5.0)
Triglycerides: 92 mg/dL (ref <150)
VLDL: 18 mg/dL (ref <30)

## 2015-06-01 LAB — COMPREHENSIVE METABOLIC PANEL
ALT: 15 U/L (ref 6–29)
AST: 14 U/L (ref 10–35)
Albumin: 4.4 g/dL (ref 3.6–5.1)
Alkaline Phosphatase: 51 U/L (ref 33–130)
BUN: 15 mg/dL (ref 7–25)
CHLORIDE: 104 mmol/L (ref 98–110)
CO2: 24 mmol/L (ref 20–31)
CREATININE: 0.72 mg/dL (ref 0.50–0.99)
Calcium: 8.9 mg/dL (ref 8.6–10.4)
Glucose, Bld: 97 mg/dL (ref 65–99)
Potassium: 4.9 mmol/L (ref 3.5–5.3)
SODIUM: 139 mmol/L (ref 135–146)
TOTAL PROTEIN: 6.7 g/dL (ref 6.1–8.1)
Total Bilirubin: 0.5 mg/dL (ref 0.2–1.2)

## 2015-06-01 MED ORDER — ALENDRONATE SODIUM 70 MG PO TABS: 70.0000 mg | ORAL_TABLET | ORAL | Status: DC

## 2015-06-01 MED ORDER — FLUOXETINE HCL 40 MG PO CAPS: 40.0000 mg | ORAL_CAPSULE | Freq: Every day | ORAL | Status: DC

## 2015-06-01 MED ORDER — ASPIRIN 81 MG PO TABS: 81.0000 mg | ORAL_TABLET | Freq: Every day | ORAL | Status: DC

## 2015-06-01 MED ORDER — ZOLPIDEM TARTRATE 5 MG PO TABS: 5.0000 mg | ORAL_TABLET | Freq: Every evening | ORAL | Status: DC | PRN

## 2015-06-01 NOTE — Progress Notes (Signed)
Subjective:   HPI  Emily Cowan is a 69 y.o. female who presents for a complete physical/med check plus/medicare annual wellness visit.  No hospitalizations in last year, just routine f/u here.  Medical team is just here currently.  Preventative care: Last ophthalmology visit: has seen eye doctor in last year, early cataracts Last dental visit: sees dentist regularly TD or Tdap:08/2009 Influenza:YES today Pneumococcal: up to date Shingles/Zostavax: up to date  Advanced directive:YES Health care power of attorney:YES Living will:YES  Concerns: Wants skin looked at for surveillance  Doing fine on medication for mood  Still living alone in the tiny house/modern tiny home she built herself, happy.  Compliant with fosamax.    Reviewed their medical, surgical, family, social, medication, and allergy history and updated chart as appropriate.   Past Medical History  Diagnosis Date  . Depression   . Osteoporosis   . Seborrheic dermatitis   . Cancer ([place]) 2002    BREAST  . Allergic rhinitis   . Hearing impaired     wears hearing aids  . Wears glasses   . Alcohol abuse   . Family history of cancer     multiple  . Hemangioma of liver 2005    MRI   . Vulvodynia     longstanding, failed Premarin cream; can't tolerate pelvic exams    Past Surgical History  Procedure Laterality Date  . Mastectomy  2002    right  . Tonsillectomy    . Wisdom tooth extraction    . Colonoscopy  2011    in [facility]    Family History  Problem Relation Age of Onset  . Cancer Mother     GI cancer  . Cancer Father     liver  . Stroke Father   . Cancer Sister     vulvar  . Cancer Brother     liver  . Diabetes Neg Hx   . Heart disease Neg Hx   . Hypertension Neg Hx   . Hyperlipidemia Neg Hx   . Cancer Brother     lung    Social History   Social History  . Marital Status: Single    Spouse Name: N/A  . Number of Children: N/A  . Years of Education: N/A    Occupational History  . Not on file.   Social History Main Topics  . Smoking status: Never Smoker   . Smokeless tobacco: Never Used  . Alcohol Use: 8.4 oz/week    14 Glasses of wine per week  . Drug Use: No  . Sexual Activity: Not Currently   Other Topics Concern  . Not on file   Social History Narrative   Single, no children, retired, lesbian but no current relationship; exercise- working in the yard, cutting down trees, Biomedical scientist, walking the dogs.  Retired Therapist, music.  [place], considering move to something smaller, more manageable as of 12/14.    Current Outpatient Prescriptions on File Prior to Visit  Medication Sig Dispense Refill  . alendronate (FOSAMAX) 70 MG tablet Take 1 tablet (70 mg total) by mouth every 7 (seven) days. Take with a full glass of water on an empty stomach. 13 tablet 3  . aspirin 81 MG tablet Take 1 tablet (81 mg total) by mouth daily. 90 tablet 4  . calcium carbonate (OS-CAL) 600 MG TABS tablet Take 600 mg by mouth daily with breakfast.    . FLUoxetine (PROZAC) 40 MG capsule TAKE  1 CAPSULE DAILY 90 capsule 1  . Glucosamine-Chondroit-Vit C-Mn (GLUCOSAMINE CHONDR 1500 COMPLX) CAPS Take 1 capsule by mouth daily.      . Multiple Vitamins-Minerals (MULTIVITAMIN WITH MINERALS) tablet Take 1 tablet by mouth daily.      Marland Kitchen zolpidem (AMBIEN) 5 MG tablet Take 1 tablet (5 mg total) by mouth at bedtime as needed. for sleep 90 tablet 1   No current facility-administered medications on file prior to visit.    No Known Allergies   Review of Systems Constitutional: -fever, -chills, -sweats, -unexpected weight change, -decreased appetite, -fatigue Allergy: -sneezing, -itching, -congestion Dermatology: -changing moles, --rash, -lumps ENT: -runny nose, -ear pain, -sore throat, -hoarseness, -sinus pain, -teeth pain, - ringing in ears, -hearing loss, -nosebleeds Cardiology: -chest pain, -palpitations, -swelling, -difficulty breathing when lying flat,  -waking up short of breath Respiratory: -cough, -shortness of breath, -difficulty breathing with exercise or exertion, -wheezing, -coughing up blood Gastroenterology: -abdominal pain, -nausea, -vomiting, -diarrhea, -constipation, -blood in stool, -changes in bowel movement, -difficulty swallowing or eating Hematology: -bleeding, -bruising  Musculoskeletal: +joint aches, -muscle aches, -joint swelling, -back pain, +neck pain, -cramping, -changes in gait Ophthalmology: denies vision changes, eye redness, itching, discharge Urology: -burning with urination, -difficulty urinating, -blood in urine, -urinary frequency, -urgency, -incontinence Neurology: -headache, -weakness, -tingling, -numbness, -memory loss, -falls, -dizziness Psychology: -depressed mood, -agitation, +sleep problems  Reviewed nurse depression and fall risk screens.        Objective:   Physical Exam  BP 110/72 mmHg  Pulse 56  Temp(Src) 97.6 F (36.4 C)  Ht 5' 6.5" (1.689 m)  Wt 153 lb (69.4 kg)  BMI 24.33 kg/m2.    General appearance: alert, no distress, WD/WN, white female, looks stated age  Skin:  Lesion 1 - right upper arm superior lateral proximal 1/3 with papular raised 55mm round somewhat crusted flesh colored lesion (new per patient in past few months) Lesion 2 - flat cruseted round 25mm lesion right upper arm superior anterior proximal 1/3 of arm, jsut anterior superior to the lesion 1 Lesion 3 - left lower neck anteriorly with 77mm raised round papular crusted flesh colored lesion (new per patient in past few months) Lesion 4 - left distal tongue with 1cm squarish shaped lesion unchagned for patient for years Lesion 5 - left upper back with round 80mm slight raiesd dome shaped pink brown lesion with well demarcated border  likley seborheic keratosis Various flat yellow brown lesions of upper and mid back suggestive of SKs bilat cheeks each with 2-3 mm brown roundish lesions unchanged per pateint, likely solar  keratoses Right posterior superior portion of tragus with 34mm diameter yellowish depression with irritated border she attribuges to recent burn from hair dye  HEENT: normocephalic, conjunctiva/corneas normal, sclerae anicteric, PERRLA, EOMi, TMs pearly, hearing aids present, nares patent, no discharge or erythema, pharynx normal Oral cavity: MMM, tongue normal, teeth in good repair Neck: supple, no lymphadenopathy, no thyromegaly, no masses, normal ROM, no bruits Chest: non tender, normal shape and expansion Heart: RRR, normal S1, S2, no murmurs Lungs: CTA bilaterally, no wheezes, rhonchi, or rales Abdomen: +bs, soft, non tender, non distended, no masses, no obvious hepatosplenomegaly, no bruits Back: non tender, normal ROM, no scoliosis Musculoskeletal: upper extremities non tender, no obvious deformity, normal ROM throughout, lower extremities non tender, no obvious deformity, normal ROM throughout Extremities: no edema, no cyanosis, no clubbing Pulses: 1+ symmetric, upper and lower extremities, normal cap refill Neurological: alert, oriented x 3, CN2-12 intact, strength normal upper extremities and lower extremities,  sensation normal throughout, DTRs 2+ throughout, no cerebellar signs, gait normal Psychiatric: normal affect, behavior normal, pleasant  Breast/gyn/rectal - deferred at her request   Assessment and Plan :    Encounter Diagnoses  Name Primary?  . Encounter for health maintenance examination in adult Yes  . Need for prophylactic vaccination and inoculation against influenza   . Osteoporosis   . History of breast cancer   . Dysthymia   . Vulvodynia   . Insomnia   . Screening for lipid disorders   . Allergic rhinitis due to pollen, unspecified rhinitis seasonality   . History of breast cancer in female   . History of alcohol abuse   . Chronic neck pain   . DDD (degenerative disc disease), cervical   . Lumbar back pain with radiculopathy affecting right lower extremity    . Changing skin lesion     Physical exam - discussed healthy lifestyle, diet, exercise, preventative care, vaccinations, and addressed their concerns.  Handout given.  osteoporosis - failed boniva.  We were unable to get Reclast approved last year. C/t Fosamax, plan for repeat bone density scan 2018 as she just started fosamax about a year ago. Advise she cut limit alcohol, continue exercise, continue calcium plus vitamin D.  Hx/o breast cancer - advised monthly SBE, yearly mammogram, she is up to date  Dysthymia - doing well on Prozac     Insomnia - discussed sleep hygiene, advise she try and not use Ambien every single day.  She is using 1/2 of the 5 milligram tablet.  Vulvodynia - declines gyn referral, we discussed the inability to do a full exam. She will consider doing a baseline pelvic ultrasound without transvaginal has at least some method of screening for gynecological cancer.   Need for influenza vaccine - vaccine counseling, VIS and flu vaccine given  Radiculopathy, sciatica, and neck pains - advised daily stertchign routine.   Counseled on the influenza virus vaccine.  Vaccine information sheet given.  Influenza vaccine given after consent obtained.  Advised she bring [place] a copy of her advanced directives.  Will be due for repeat colonoscopy 2017.  Last one was 2011.  Follow-up pending studies.   During the course of the visit the patient was educated and counseled about appropriate screening and preventive services including:    Pneumococcal vaccine   Influenza vaccine  Td vaccine  Screening mammography  Bone densitometry screening  Colorectal cancer screening  Nutrition counseling   Advanced directives: has an advanced directive - a copy HAS NOT been provided.  Medicare Attestation I have personally reviewed: The patient's medical and social history Their use of alcohol, tobacco or illicit drugs Their current medications and supplements The  patient's functional ability including ADLs,fall risks, home safety risks, cognitive, and hearing and visual impairment Diet and physical activities Evidence for depression or mood disorders  The patient's weight, height, BMI, and visual acuity have been recorded in the chart.  I have made referrals, counseling, and provided education to the patient based on review of the above and I have provided the patient with a written personalized care plan for preventive services.

## 2015-06-01 NOTE — Patient Instructions (Signed)
MEDICARE PREVENTATIVE SERVICES (FEMALE) AND PERSONALIZED PLAN for Brisia Schuermann June 01, 2015  CONDITIONS OR RISKS IDENTIFIED TODAY: Skin lesions - return at your convenience for skin lesion excisions   SPECIFIC RECOMMENDATIONS:  Bring [place] a copy of your advanced directives (living will, health care power of attorney)  You are due for repeat colonoscopy in 2017 whenever you are ready  Get your mammogram yearly  You will be due for bone density screen in 2018  Influenza vaccine: updated today All other routine vaccines up to date.  Return pending labs   GENERAL RECOMMENDATIONS FOR GOOD HEALTH:  Supplements:  . Take a daily baby Aspirin 81mg  at bedtime for heart health unless you have a history of gastrointestinal bleed, allergy to aspirin, or are already taking higher dose Aspirin or other antiplatelet or blood thinner medication.   . Consume 1200 mg of Calcium daily through dietary calcium or supplement if you are female age 69 or older, or men 80 and older.   Men aged 51-70 should consume 1000 mg of Calcium daily. . Take 600 IU of Vitamin D daily.  Take 800 IU of Calcium daily if you are older than age 109.  . Take a general multivitamin daily.   Healthy diet: Eat a variety of foods, including fruits, vegetables, vegetable protein such as beans, lentils, tofu, and grains, such as rice.  Limit meat or animal protein, but if you eat meat, choose leans cuts such as chicken, fish, or [place].  Drink plenty of water daily.  Decrease saturated fat in the diet, avoid lots of red meat, processed foods, sweets, fast foods, and fried foods.  Limit salt and caffeine intake.  Exercise: Aerobic exercise helps maintain good heart health. Weight bearing exercise helps keep bones and muscles working strong.  We recommend at least 30-40 minutes of exercise most days of the week.   Fall prevention: Falls are the leading cause of injuries, accidents, and accidental deaths in people over the age of  32. Falling is a real threat to your ability to live on your own.  Causes include poor eyesight or poor hearing, illness, poor lighting, throw rugs, clutter in your home, and medication side effects causing dizziness or balance problems.  Such medications can include medications for depression, sleep problems, high blood pressure, diabetes, and heart conditions.   PREVENTION  Be sure your home is as safe as possible. Here are some tips:  Wear shoes with non-skid soles (not house slippers).   Be sure your home and outside area are well lit.   Use night lights throughout your house, including hallways and stairways.   Remove clutter and clean up spills on floors and walkways.   Remove throw rugs or fasten them to the floor with carpet tape. Tack down carpet edges.   Do not place electrical cords across pathways.   Install grab bars in your bathtub, shower, and toilet area. Towel bars should not be used as a grab bar.   Install handrails on both sides of stairways.   Do not climb on stools or stepladders. Get someone else to help with jobs that require climbing.   Do not wax your floors at all, or use a non-skid wax.   Repair uneven or unsafe sidewalks, walkways or stairs.   Keep frequently used items within reach.   Be aware of pets so you do not trip.  Get regular check-ups from your doctor, and take good care of yourself:  Have your eyes checked every year  for vision changes, cataracts, glaucoma, and other eye problems. Wear eyeglasses as directed.   Have your hearing checked every 2 years, or anytime you or others think that you cannot hear well. Use hearing aids as directed.   See your caregiver if you have foot pain or corns. Sore feet can contribute to falls.   Let your caregiver know if a medicine is making you feel dizzy or making you lose your balance.   Use a cane, walker, or wheelchair as directed. Use walker or wheelchair brakes when getting in and out.   When you  get up from bed, sit on the side of the bed for 1 to 2 minutes before you stand up. This will give your blood pressure time to adjust, and you will feel less dizzy.   If you need to go to the bathroom often, consider using a bedside commode.  Disease prevention:  If you smoke or chew tobacco, find out from your caregiver how to quit. It can literally save your life, no matter how long you have been a tobacco user. If you do not use tobacco, never begin. Medicare does cover some smoking cessation counseling.  Maintain a healthy diet and normal weight. Increased weight leads to problems with blood pressure and diabetes. We check your height, weight, and BMI as part of your yearly visit.  The Body Mass Index or BMI is a way of measuring how much of your body is fat. Having a BMI above 27 increases the risk of heart disease, diabetes, hypertension, stroke and other problems related to obesity. Your caregiver can help determine your BMI and based on it develop an exercise and dietary program to help you achieve or maintain this important measurement at a healthful level.  High blood pressure causes heart and blood vessel problems.  Persistent high blood pressure should be treated with medicine if weight loss and exercise do not work.  We check your blood pressure as part of your yearly visit.  Avoid drinking alcohol in excess (more than two drinks per day).  Avoid use of street drugs. Do not share needles with anyone. Ask for professional help if you need assistance or instructions on stopping the use of alcohol, cigarettes, and/or drugs.  Brush your teeth twice a day with fluoride toothpaste, and floss once a day. Good oral hygiene prevents tooth decay and gum disease. The problems can be painful, unattractive, and can cause other health problems. Visit your dentist for a routine oral and dental checkup and preventive care every 6-12 months.   See your eye doctor yearly for routine screening for things  like glaucoma.  Look at your skin regularly.  Use a mirror to look at your back. Notify your caregivers of changes in moles, especially if there are changes in shapes, colors, a size larger than a pencil eraser, an irregular border, or development of new moles.  Safety:  Use seatbelts 100% of the time, whether driving or as a passenger.  Use safety devices such as hearing protection if you work in environments with loud noise or significant background noise.  Use safety glasses when doing any work that could send debris in to the eyes.  Use a helmet if you ride a bike or motorcycle.  Use appropriate safety gear for contact sports.  Talk to your caregiver about gun safety.  Use sunscreen with a SPF (or skin protection factor) of 15 or greater.  Lighter skinned people are at a greater risk of skin cancer.  Don't forget to also wear sunglasses in order to protect your eyes from too much damaging sunlight. Damaging sunlight can accelerate cataract formation.   If you have multiple sexual partners, or if you are not in a monogamous relationship, practice safe sex. Use condoms. Condoms are used to help reduce the spread of sexually transmitted infections (or STIs).  Consider an HIV test if you have never been tested.  Consider routine screening for STIs if you have multiple sexual partners.   Keep carbon monoxide and smoke detectors in your home functioning at all times. Change the batteries every 6 months or use a model that plugs into the wall or is hard wired in.   END OF LIFE PLANNING/ADVANCED DIRECTIVES Advance health-care planning is deciding the kind of care you want at the end of life. While alert competent adults are able to exercise their rights to make health care and financial decisions, problems arise when an individual becomes unconscious, incapacitated, or otherwise unable to communicate or make such decisions. Advance health care directives are the legal documents in which you give written  instructions about your choices limited, aggressive or palliative care if, in the future, you cannot speak for yourself.  Advanced directives include the following: [place] allows you to appoint someone to act as your health care agent to make health care decisions for you should it be determined by your health care provider that you are no longer able to make these decisions for yourself.  A Living Will is a legal document in which you can declare that under certain conditions you desire your life not be prolonged by extraordinary or artificial means during your last illness or when you are near death. We can provide you with sample advanced directives, you can get an attorney to prepare these for you, or you can visit [place] Secretary of State's website for additional information and resources at [email or web address]  Further, I recommend you have an attorney prepare a Will and Durable Power of Attorney if you haven't done so already.  Please get [place] a copy of your health care Advanced Directives.   PREVENTATIV E CARE RECOMMENDATIONS:  Vaccinations: We recommend the following vaccinations as part of your preventative care:  Pneumococcal vaccine is recommended to protect against certain types of pneumonia.  This is normally recommended for adults age 29 or older once, or up to every 5 years for those at high risk.  The vaccine is also recommended for adults younger than 69 years old with certain underlying conditions that make them high risk for pneumonia.  Influenza vaccine is recommended to protect against seasonal influenza or "the flu." Influenza is a serious disease that can lead to hospitalization and sometimes even death. Traditional flu vaccines (called trivalent vaccines) are made to protect against three flu viruses; an influenza A (H1N1) virus, an influenza A (H3N2) virus, and an influenza B virus. In addition, there are flu vaccines made to protect  against four flu viruses (called "quadrivalent" vaccines). These vaccines protect against the same viruses as the trivalent vaccine and an additional B virus.  We recommend the high dose influenza vaccine to those 65 years and older.  Hepatitis B vaccine to protect against a form of infection of the liver by a virus acquired from blood or body fluids, particularly for high risk groups.  Td or Tdap vaccine to protect against Tetanus, diphtheria and pertussis which can be very serious.  These diseases are caused by bacteria.  Diphtheria  and pertussis are spread from person to person through coughing or sneezing.  Tetanus enters the body through cuts, scratches, or wounds.  Tetanus (Lockjaw) causes painful muscle tightening and stiffness, usually all over the body.  Diphtheria can cause a thick coating to form in the back of the throat.  It can lead to breathing problems, paralysis, heart failure, and death.  Pertussis (Whooping Cough) causes severe coughing spells, which can cause difficulty breathing, vomiting and disturbed sleep.  Td or Tdap is usually given every 10 years.  Shingles vaccine to protect against Varicella Zoster if you are older than age 69, or younger than 69 years old with certain underlying illness.    Cancer Screening: Most routine colon cancer screening begins at the age of 49.  Subsequent colonoscopies are performed either every 5-10 years for normal screening, or every 2-5 years for higher risks patients, up until age 17 years of age. Annual screening is done with easy to use take-home tests to check for hidden blood in the stool called hemoccult tests.  Sigmoidoscopy or colonoscopy can detect the earliest forms of colon cancer and is life saving. These tests use a small camera at the end of a tube to directly examine the colon.   Pelvic Exam and Pap Smear: Pelvic exams and pap smears are performed routinely to evaluate for abnormalities as well as cancers including cervical and  vaginal cancers.  This is generally performed every 2-3 years for most women, or more frequently for higher risk patients.  Mammograms: Mammograms are used to screen for breast cancer.  Medicare covers baseline screening once from ages 89-52 years old, but will cover mammograms yearly for those 40 years and older.  In accordance with other guidelines, you may not need a mammogram every year though.  The decision on how frequently you need a mammogram should be discussed with you medical provider.    Osteoporosis Screening: Screening for osteoporosis usually begins at age 80 for women, and can be done as frequent as every 2 years.  However, women or men with higher risk of osteoporosis may be screened earlier than age 67.  Osteoporosis or low bone mass is diminished bone strength from alterations in bone architecture leading to bone fragility and increased fracture risk.     Cardiovascular Screening: Fat and cholesterol leaves deposits in your arteries that can block them. This causes heart disease and vessel disease elsewhere in your body.  If your cholesterol is found to be high, or if you have heart disease or certain other medical conditions, then you may need to have your cholesterol monitored frequently and be treated with medication. Cardiovascular screening in the form of lab tests for cholesterol, HDL and triglycerides can be done every 5 years.  A screening electrocardiogram can be done as part of the Welcome to Medicare physical.  Diabetes Screening: Diabetes screening can be done at least every 3 years for those with risk factors,  or every 6-61months for prediabetic patients.  Screening includes fasting blood sugar test or glucose tolerance test.  Risk factors include hypertension, dyslipidemia, obesity, previously abnormal glucose tests, family history of diabetes, age 16 years or older, and history of gestations diabetes.   AAA (abdominal aortic aneurysm) Screening: Medicare allows for a  one time ultrasound to screen for abdominal aortic aneurysm if done as a referral as part of the Welcome to Medicare exam.  Men eligible for this screening include those men between age 43-35 years of age who have smoked at  least 100 cigarettes in his lifetime and/or has a family history of AAA.  HIV Screening:  Medicare allows for yearly screening for patients at high risk for contracting HIV disease.

## 2015-06-02 ENCOUNTER — Telehealth: Payer: Self-pay

## 2015-06-02 LAB — VITAMIN D 25 HYDROXY (VIT D DEFICIENCY, FRACTURES): Vit D, 25-Hydroxy: 30 ng/mL (ref 30–100)

## 2015-06-02 MED ORDER — ZOLPIDEM TARTRATE 5 MG PO TABS: 5.0000 mg | ORAL_TABLET | Freq: Every evening | ORAL | Status: DC | PRN

## 2015-06-02 NOTE — Telephone Encounter (Signed)
Phoned in medication and mailed AV summary.

## 2015-06-02 NOTE — Telephone Encounter (Signed)
-----   Message from Carlena Hurl, PA-C sent at 06/01/2015  8:40 PM EDT ----- Mail her updated AV summary to her  Call out the Ambien

## 2016-03-21 ENCOUNTER — Encounter: Payer: Self-pay | Admitting: Medical

## 2016-03-21 ENCOUNTER — Ambulatory Visit (INDEPENDENT_AMBULATORY_CARE_PROVIDER_SITE_OTHER): Payer: Commercial Managed Care - HMO | Admitting: Medical

## 2016-03-21 VITALS — BP 102/60 | HR 77 | Resp 18 | Ht 68.0 in | Wt 149.2 lb

## 2016-03-21 DIAGNOSIS — S4991XA Unspecified injury of right shoulder and upper arm, initial encounter: Secondary | ICD-10-CM | POA: Diagnosis not present

## 2016-03-21 DIAGNOSIS — W108XXA Fall (on) (from) other stairs and steps, initial encounter: Secondary | ICD-10-CM | POA: Diagnosis not present

## 2016-03-21 DIAGNOSIS — S5001XA Contusion of right elbow, initial encounter: Secondary | ICD-10-CM

## 2016-03-21 MED ORDER — IBUPROFEN 800 MG PO TABS: 800.0000 mg | ORAL_TABLET | Freq: Three times a day (TID) | ORAL | 0 refills | Status: DC | PRN

## 2016-03-21 NOTE — Progress Notes (Signed)
Subjective:     Patient ID: Emily Cowan, female   DOB: 30-Jun-1946, 70 y.o.   MRN: [id number]  HPI Chief Complaint  Patient presents with  . Fall    fell off of a step ladder on Monday. Using ice and IBU 800 and immobile with no relief in pain of Right shoulder injury.    Here for injury, right shoulder pain.   Fell off small step ladder 03/19/16.   Was up on ladder about 3 '.  Fell backwards onto back, landed somewhat on right shoulder.   No head injury, no LOC.  Back doesn't hurt though.  Landed on mulch.  Was able to get up and ambulate at the time.   Had immediate shoulder pain.  Has continued to have pain although ROM is relatively normal.  Is a little better today.  Been using rest, Ibuprofen 800mg  every 8 hours.   Trying to keep shoulder immobile. Doesn't hurt if in normal position, but hurts if lifting up arm over 90 degrees.   Has had pain in shoulder prior, would catch at a point.   No numbness, no tingling, no weakness.  Bruise on right elbow area.  No other aggravating or relieving factors. No other complaint.  Review of Systems     Objective:   Physical Exam  BP 102/60 (BP Location: Left Arm, Patient Position: Sitting)   Pulse 77   Resp 18   Ht 5\' 8"  (1.727 m)   Wt 149 lb 3.2 oz (67.7 kg)   BMI 22.69 kg/m   Gen: wd, wn, white female,NAD Skin: purplish bruising and swelling from bruising over medial right elbow, small abrasion in same area, small abrasion right mid forearm anteriorly, otherwise no bruising of back, shoulder or other Neck: tender posterior neck but she notes this is chronic, somewhat reduced neck ROM in general, about 85% of normal Back tender over right upper back and over scapula mildly, no swelling or bruising Tender over anterior deltoid and AC joint, mild tenderness over proximal bicep just distal to origin, she has good full ROM of shoulder with active ROM, but has pain with adduction when she hits 80 degrees, pain with impingement test mildly, somewhat  reduced internal and external ROM.  Otherwise shoulder and arm nontender, normal ROM without pain otherwise, left arm unremarkable exam Arms neurovascularly intact      Assessment:     Encounter Diagnoses  Name Primary?  . Right shoulder injury, initial encounter Yes  . Fall (on) (from) other stairs and steps, initial encounter   . Elbow contusion, right, initial encounter        Plan:     Use ice such as bag of frozen peas 20 min three times daily the next few days, arm sling for an hour at a time, relative rest for the next 2-3 days, then work on gradual increased in ROM and stretching.  Limit weight bearing of right shoulder the next week.  If not much improved within a week, then recheck. Can use Ibuprofen 800mg  q8 hr the next few days, then either prn use or use 1/2 tablet at a time.  We will hold off on xray today given exam findings and lower level of pain at this time.  No major concern for fracture at this time.  F/u prn.

## 2016-03-29 ENCOUNTER — Other Ambulatory Visit: Payer: Self-pay | Admitting: Medical

## 2016-03-29 ENCOUNTER — Encounter: Payer: Self-pay | Admitting: Medical

## 2016-03-29 ENCOUNTER — Telehealth: Payer: Self-pay | Admitting: Medical

## 2016-03-29 ENCOUNTER — Ambulatory Visit
Admission: RE | Admit: 2016-03-29 | Discharge: 2016-03-29 | Disposition: A | Discharge status: Home / Self Care | Payer: Commercial Managed Care - HMO | Source: Ambulatory Visit | Attending: Medical | Admitting: Medical

## 2016-03-29 DIAGNOSIS — M542 Cervicalgia: Secondary | ICD-10-CM

## 2016-03-29 DIAGNOSIS — W19XXXA Unspecified fall, initial encounter: Secondary | ICD-10-CM

## 2016-03-29 DIAGNOSIS — S199XXA Unspecified injury of neck, initial encounter: Secondary | ICD-10-CM | POA: Diagnosis not present

## 2016-03-29 DIAGNOSIS — S4991XA Unspecified injury of right shoulder and upper arm, initial encounter: Secondary | ICD-10-CM | POA: Diagnosis not present

## 2016-03-29 DIAGNOSIS — M25511 Pain in right shoulder: Secondary | ICD-10-CM

## 2016-03-29 MED ORDER — TRAMADOL HCL 50 MG PO TABS: 50.0000 mg | ORAL_TABLET | Freq: Four times a day (QID) | ORAL | 0 refills | Status: DC | PRN

## 2016-03-29 NOTE — Telephone Encounter (Signed)
Pt said that it is in her rt shoulder, just her shoulder maybe in her neck some.

## 2016-03-29 NOTE — Telephone Encounter (Signed)
Pt will go as soon as she can

## 2016-03-29 NOTE — Telephone Encounter (Signed)
I received from her about pain.   If in a lot of pain or worse, we may need to check xray.    Call out ultram for pain, but may need to get her back in depending upon pain and how limited her use of the arm is.

## 2016-03-29 NOTE — Telephone Encounter (Signed)
Xray orders in for right shoulder, neck

## 2016-03-29 NOTE — Telephone Encounter (Signed)
Called pt and got disconnected will call back later

## 2016-03-29 NOTE — Telephone Encounter (Signed)
Phoned in, and pt stated she has tramadol at home and no help. Wants xray?

## 2016-03-29 NOTE — Telephone Encounter (Signed)
Verify where the pain is.  Given the type of fall, want to see if she has pain in back too, or just shoulder?  If back to or chest, where specifically, left, right, upper, mid, low?

## 2016-03-30 ENCOUNTER — Encounter: Payer: Self-pay | Admitting: Medical

## 2016-04-03 ENCOUNTER — Other Ambulatory Visit: Payer: Self-pay | Admitting: Medical

## 2016-04-03 ENCOUNTER — Encounter: Payer: Self-pay | Admitting: Medical

## 2016-04-03 ENCOUNTER — Telehealth: Payer: Self-pay | Admitting: Medical

## 2016-04-03 DIAGNOSIS — M25519 Pain in unspecified shoulder: Secondary | ICD-10-CM

## 2016-04-03 DIAGNOSIS — M542 Cervicalgia: Secondary | ICD-10-CM

## 2016-04-03 MED ORDER — TRAMADOL HCL 50 MG PO TABS: 50.0000 mg | ORAL_TABLET | Freq: Four times a day (QID) | ORAL | 0 refills | Status: DC | PRN

## 2016-04-03 NOTE — Telephone Encounter (Signed)
Refer to physical therapy, morning appt only, shoulder and neck pain, s/p recent fall.     Also call out 1 more round of Ultram, update electronic order as well.

## 2016-04-03 NOTE — Telephone Encounter (Signed)
Called in med to pharmacy and faxed over referral to break through PT for pt

## 2016-04-10 DIAGNOSIS — M25511 Pain in right shoulder: Secondary | ICD-10-CM | POA: Diagnosis not present

## 2016-04-17 DIAGNOSIS — M25511 Pain in right shoulder: Secondary | ICD-10-CM | POA: Diagnosis not present

## 2016-04-20 DIAGNOSIS — M25511 Pain in right shoulder: Secondary | ICD-10-CM | POA: Diagnosis not present

## 2016-04-24 DIAGNOSIS — M25511 Pain in right shoulder: Secondary | ICD-10-CM | POA: Diagnosis not present

## 2016-04-27 DIAGNOSIS — M25511 Pain in right shoulder: Secondary | ICD-10-CM | POA: Diagnosis not present

## 2016-05-01 DIAGNOSIS — M25511 Pain in right shoulder: Secondary | ICD-10-CM | POA: Diagnosis not present

## 2016-05-02 ENCOUNTER — Encounter: Payer: Self-pay | Admitting: Medical

## 2016-05-03 ENCOUNTER — Other Ambulatory Visit: Payer: Self-pay | Admitting: Medical

## 2016-05-03 MED ORDER — IBUPROFEN 800 MG PO TABS: 800.0000 mg | ORAL_TABLET | Freq: Three times a day (TID) | ORAL | 0 refills | Status: DC | PRN

## 2016-05-04 DIAGNOSIS — M25511 Pain in right shoulder: Secondary | ICD-10-CM | POA: Diagnosis not present

## 2016-05-10 DIAGNOSIS — M25511 Pain in right shoulder: Secondary | ICD-10-CM | POA: Diagnosis not present

## 2016-05-15 DIAGNOSIS — M25511 Pain in right shoulder: Secondary | ICD-10-CM | POA: Diagnosis not present

## 2016-05-17 DIAGNOSIS — M25511 Pain in right shoulder: Secondary | ICD-10-CM | POA: Diagnosis not present

## 2016-05-22 DIAGNOSIS — M25511 Pain in right shoulder: Secondary | ICD-10-CM | POA: Diagnosis not present

## 2016-05-24 DIAGNOSIS — M25511 Pain in right shoulder: Secondary | ICD-10-CM | POA: Diagnosis not present

## 2016-05-29 ENCOUNTER — Other Ambulatory Visit: Payer: Self-pay | Admitting: Medical

## 2016-05-30 MED ORDER — IBUPROFEN 800 MG PO TABS: 800.0000 mg | ORAL_TABLET | Freq: Three times a day (TID) | ORAL | 0 refills | Status: DC | PRN

## 2016-06-19 ENCOUNTER — Encounter: Payer: Self-pay | Admitting: Internal Medicine

## 2016-06-19 ENCOUNTER — Ambulatory Visit (INDEPENDENT_AMBULATORY_CARE_PROVIDER_SITE_OTHER): Payer: Commercial Managed Care - HMO | Admitting: Medical

## 2016-06-19 ENCOUNTER — Encounter: Payer: Self-pay | Admitting: Medical

## 2016-06-19 VITALS — BP 102/70 | HR 80 | Wt 153.8 lb

## 2016-06-19 DIAGNOSIS — J301 Allergic rhinitis due to pollen: Secondary | ICD-10-CM | POA: Diagnosis not present

## 2016-06-19 DIAGNOSIS — Z0000 Encounter for general adult medical examination without abnormal findings: Secondary | ICD-10-CM

## 2016-06-19 DIAGNOSIS — F341 Dysthymic disorder: Secondary | ICD-10-CM

## 2016-06-19 DIAGNOSIS — G4700 Insomnia, unspecified: Secondary | ICD-10-CM | POA: Diagnosis not present

## 2016-06-19 DIAGNOSIS — E2839 Other primary ovarian failure: Secondary | ICD-10-CM

## 2016-06-19 DIAGNOSIS — E559 Vitamin D deficiency, unspecified: Secondary | ICD-10-CM | POA: Diagnosis not present

## 2016-06-19 DIAGNOSIS — Z1211 Encounter for screening for malignant neoplasm of colon: Secondary | ICD-10-CM

## 2016-06-19 DIAGNOSIS — M5030 Other cervical disc degeneration, unspecified cervical region: Secondary | ICD-10-CM | POA: Diagnosis not present

## 2016-06-19 DIAGNOSIS — E7800 Pure hypercholesterolemia, unspecified: Secondary | ICD-10-CM | POA: Diagnosis not present

## 2016-06-19 DIAGNOSIS — N94819 Vulvodynia, unspecified: Secondary | ICD-10-CM

## 2016-06-19 DIAGNOSIS — K148 Other diseases of tongue: Secondary | ICD-10-CM

## 2016-06-19 DIAGNOSIS — Z124 Encounter for screening for malignant neoplasm of cervix: Secondary | ICD-10-CM | POA: Diagnosis not present

## 2016-06-19 DIAGNOSIS — Z1231 Encounter for screening mammogram for malignant neoplasm of breast: Secondary | ICD-10-CM

## 2016-06-19 DIAGNOSIS — Z7185 Encounter for immunization safety counseling: Secondary | ICD-10-CM

## 2016-06-19 DIAGNOSIS — R978 Other abnormal tumor markers: Secondary | ICD-10-CM | POA: Diagnosis not present

## 2016-06-19 DIAGNOSIS — M542 Cervicalgia: Secondary | ICD-10-CM | POA: Diagnosis not present

## 2016-06-19 DIAGNOSIS — Z1239 Encounter for other screening for malignant neoplasm of breast: Secondary | ICD-10-CM

## 2016-06-19 DIAGNOSIS — R42 Dizziness and giddiness: Secondary | ICD-10-CM | POA: Diagnosis not present

## 2016-06-19 DIAGNOSIS — M810 Age-related osteoporosis without current pathological fracture: Secondary | ICD-10-CM

## 2016-06-19 DIAGNOSIS — G8929 Other chronic pain: Secondary | ICD-10-CM

## 2016-06-19 DIAGNOSIS — Z853 Personal history of malignant neoplasm of breast: Secondary | ICD-10-CM

## 2016-06-19 DIAGNOSIS — H9190 Unspecified hearing loss, unspecified ear: Secondary | ICD-10-CM

## 2016-06-19 DIAGNOSIS — Z23 Encounter for immunization: Secondary | ICD-10-CM | POA: Diagnosis not present

## 2016-06-19 DIAGNOSIS — M5417 Radiculopathy, lumbosacral region: Secondary | ICD-10-CM

## 2016-06-19 DIAGNOSIS — E785 Hyperlipidemia, unspecified: Secondary | ICD-10-CM | POA: Insufficient documentation

## 2016-06-19 DIAGNOSIS — M5416 Radiculopathy, lumbar region: Secondary | ICD-10-CM

## 2016-06-19 DIAGNOSIS — L989 Disorder of the skin and subcutaneous tissue, unspecified: Secondary | ICD-10-CM

## 2016-06-19 DIAGNOSIS — Z7189 Other specified counseling: Secondary | ICD-10-CM

## 2016-06-19 LAB — CBC
HCT: 39.1 % (ref 35.0–45.0)
Hemoglobin: 12.8 g/dL (ref 11.7–15.5)
MCH: 30.9 pg (ref 27.0–33.0)
MCHC: 32.7 g/dL (ref 32.0–36.0)
MCV: 94.4 fL (ref 80.0–100.0)
MPV: 9.7 fL (ref 7.5–12.5)
PLATELETS: 320 10*3/uL (ref 140–400)
RBC: 4.14 MIL/uL (ref 3.80–5.10)
RDW: 13.6 % (ref 11.0–15.0)
WBC: 5.6 10*3/uL (ref 4.0–10.5)

## 2016-06-19 LAB — COMPREHENSIVE METABOLIC PANEL
ALK PHOS: 47 U/L (ref 33–130)
ALT: 16 U/L (ref 6–29)
AST: 15 U/L (ref 10–35)
Albumin: 4.1 g/dL (ref 3.6–5.1)
BUN: 13 mg/dL (ref 7–25)
CHLORIDE: 108 mmol/L (ref 98–110)
CO2: 23 mmol/L (ref 20–31)
CREATININE: 0.73 mg/dL (ref 0.60–0.93)
Calcium: 8.8 mg/dL (ref 8.6–10.4)
GLUCOSE: 93 mg/dL (ref 65–99)
POTASSIUM: 4.7 mmol/L (ref 3.5–5.3)
SODIUM: 139 mmol/L (ref 135–146)
Total Bilirubin: 0.4 mg/dL (ref 0.2–1.2)
Total Protein: 6.5 g/dL (ref 6.1–8.1)

## 2016-06-19 LAB — LIPID PANEL
CHOL/HDL RATIO: 4.2 ratio (ref <=5.0)
CHOLESTEROL: 245 mg/dL — AB (ref 125–200)
HDL: 58 mg/dL (ref >=46)
LDL CALC: 159 mg/dL — AB (ref <130)
Triglycerides: 142 mg/dL (ref <150)
VLDL: 28 mg/dL (ref <30)

## 2016-06-19 LAB — TSH: TSH: 1.24 mIU/L

## 2016-06-19 NOTE — Progress Notes (Signed)
Subjective:    Emily Cowan is a 70 y.o. female who presents for Preventative Services visit and chronic medical problems/med check visit.    Primary Care Provider Crisoforo Oxford, PA-C here for primary care  Current Health Care Team:  Dentist, Dr. none  Eye doctor, Dr. Vinetta Bergamo  Medical Services you may have received from other than [facility] providers in the past year (date may be approximate) Physical therapy  Exercise Current exercise habits: The patient does not participate in regular exercise at present.   May go to [place] next fall, so has a plan to do better with diet and exercise.  Wants to go for 3 weeks with some friends.  Nutrition/Diet Current diet: in general, an "unhealthy" diet  Depression Screen Depression screen PHQ 2/9 06/19/2016  Decreased Interest 1  Down, Depressed, Hopeless 1  PHQ - 2 Score 2  Altered sleeping 2  Tired, decreased energy 2  Change in appetite 0  Feeling bad or failure about yourself  0  Trouble concentrating 0  Moving slowly or fidgety/restless 0  Suicidal thoughts 0  PHQ-9 Score 6    Activities of Daily Living Screen/Functional Status Survey Is the patient deaf or have difficulty hearing?: Yes (uses hearing aids ok) Does the patient have difficulty seeing, even when wearing glasses/contacts?: No Does the patient have difficulty concentrating, remembering, or making decisions?: No Does the patient have difficulty walking or climbing stairs?: No Does the patient have difficulty dressing or bathing?: No Does the patient have difficulty doing errands alone such as visiting a doctor's office or shopping?: No  Can patient draw a clock face showing 3:15 o'clock  Fall Risk Screen Fall Risk  06/19/2016 06/01/2015 04/07/2014  Falls in the past year? Yes No No  Number falls in past yr: 1 - -  Injury with Fall? Yes - -  Risk for fall due to : Impaired balance/gait - -    Gait Assessment: Normal gait observed  yes  Advanced directives Does patient have a [place]? Yes Does patient have a Living Will? Yes  Past Medical History:  Diagnosis Date  . Alcohol abuse   . Allergic rhinitis   . Cancer ([place]) 2002   BREAST  . Depression   . Family history of cancer    multiple  . Hearing impaired    wears hearing aids  . Hemangioma of liver 2005   MRI   . Osteoporosis   . Seborrheic dermatitis   . Vulvodynia    longstanding, failed Premarin cream; can't tolerate pelvic exams  . Wears glasses     Past Surgical History:  Procedure Laterality Date  . COLONOSCOPY  2011   in [facility]  . MASTECTOMY  2002   right  . TONSILLECTOMY    . WISDOM TOOTH EXTRACTION      Social History   Social History  . Marital status: Single    Spouse name: N/A  . Number of children: N/A  . Years of education: N/A   Occupational History  . Not on file.   Social History Main Topics  . Smoking status: Never Smoker  . Smokeless tobacco: Never Used  . Alcohol use 8.4 oz/week    14 Glasses of wine per week  . Drug use: No  . Sexual activity: Not Currently   Other Topics Concern  . Not on file   Social History Narrative   Single, no children, retired, lesbian but no current relationship; exercise- working  in the yard, cutting down trees, landscaping, walking the dogs.  Retired Therapist, music.  [place], considering move to something smaller, more manageable as of 12/14.    Family History  Problem Relation Age of Onset  . Cancer Mother     GI cancer  . Cancer Father     liver  . Stroke Father   . Cancer Sister     vulvar  . Cancer Brother     liver  . Diabetes Neg Hx   . Heart disease Neg Hx   . Hypertension Neg Hx   . Hyperlipidemia Neg Hx   . Cancer Brother     lung     Current Outpatient Prescriptions:  .  alendronate (FOSAMAX) 70 MG tablet, Take 1 tablet (70 mg total) by mouth every 7 (seven) days. Take with a full glass of water on an empty  stomach., Disp: 13 tablet, Rfl: 3 .  aspirin 81 MG tablet, Take 1 tablet (81 mg total) by mouth daily., Disp: 90 tablet, Rfl: 4 .  calcium carbonate (OS-CAL) 600 MG TABS tablet, Take 600 mg by mouth daily with breakfast., Disp: , Rfl:  .  FLUoxetine (PROZAC) 40 MG capsule, Take 1 capsule (40 mg total) by mouth daily., Disp: 90 capsule, Rfl: 3 .  Glucosamine-Chondroit-Vit C-Mn (GLUCOSAMINE CHONDR 1500 COMPLX) CAPS, Take 1 capsule by mouth daily.  , Disp: , Rfl:  .  ibuprofen (ADVIL,MOTRIN) 800 MG tablet, Take 1 tablet (800 mg total) by mouth every 8 (eight) hours as needed., Disp: 30 tablet, Rfl: 0 .  Multiple Vitamins-Minerals (MULTIVITAMIN WITH MINERALS) tablet, Take 1 tablet by mouth daily.  , Disp: , Rfl:  .  zolpidem (AMBIEN) 5 MG tablet, Take 1 tablet (5 mg total) by mouth at bedtime as needed. for sleep, Disp: 90 tablet, Rfl: 1  No Known Allergies  History reviewed: allergies, current medications, past family history, past medical history, past social history, past surgical history and problem list  Chronic issues discussed: Osteoporosis Vit D deficiency chronic neck pain Her fall, shoulder injury few months ago  Acute issues discussed: Had fall and shoulder injury few months ago, ended up completing 10 PT visits.   Continues to have chronic neck pain.    Objective:   Biometrics BP 102/70   Pulse 80   Wt 153 lb 12.8 oz (69.8 kg)   BMI 23.39 kg/m   Wt Readings from Last 3 Encounters:  06/19/16 153 lb 12.8 oz (69.8 kg)  03/21/16 149 lb 3.2 oz (67.7 kg)  06/01/15 153 lb (69.4 kg)    Cognitive Testing  Alert? Yes  Normal Appearance?Yes  Oriented to person? Yes  Place? Yes   Time? Yes  Recall of three objects?  Yes  Can perform simple calculations? Yes  Displays appropriate judgment?Yes  Can read the correct time from a watch face?Yes  General appearance: alert, no distress, WD/WN, white female  Nutritional Status: Inadequate calore intake? no Loss of muscle mass?  yes Loss of fat beneath skin? yes Localized or general edema? no Diminished functional status? no  Other pertinent exam: Skin:   Lesion 1 - right upper arm superior lateral proximal 1/3 with papular raised 33mm round somewhat crusted flesh colored lesion  Lesion 2 - flat crusted round 70mm lesion right upper arm superior anterior proximal 1/3 of arm, just anterior superior to the lesion 1 Lesion 3 - left lower neck anteriorly with 32mm raised round papular crusted flesh colored lesion  Lesion 5 -  left upper back with round 76mm slight raised dome shaped pink brown lesion with well demarcated border  likely seborrheic keratosis Various flat yellow brown lesions of upper and mid back suggestive of SKs bilat cheeks each with 2-3 mm brown roundish lesions unchanged per patient, likely solar keratoses HEENT: normocephalic, sclerae anicteric, TMs pearly, nares patent, no discharge or erythema, pharynx normal Oral cavity: MMM, left anterior tongue with 21mm raised somewhat squarish pale lesion unchanged for >1year,  teeth in good repair Neck: supple, no lymphadenopathy, no thyromegaly, no masses Heart: RRR, normal S1, S2, no murmurs Lungs: CTA bilaterally, no wheezes, rhonchi, or rales Abdomen: +bs, soft, non tender, non distended, no masses, no hepatomegaly, no splenomegaly Musculoskeletal: limited exam, arms, legs nontender, no swelling, no obvious deformity Extremities: no edema, no cyanosis, no clubbing Pulses: 1+ symmetric, upper and lower extremities, normal cap refill Neurological: alert, oriented x 3, CN2-12 intact, strength normal upper extremities and lower extremities, sensation normal throughout, DTRs 2+ throughout, no cerebellar signs, gait normal Psychiatric: normal affect, behavior normal, pleasant  Breast: right breast implant, superior surgical scar horizontally across breast, left breast without lesions, lumps.  No lymphadenopathy Declines pelvic exam Declines rectal exam   Adult  ECG Report  Indication: ectopic beat on exam, physical  Rate: 69 bpm  Rhythm: normal sinus rhythm and premature atrial contractions (PAC)  QRS Axis: 42 degrees  PR Interval: 142ms  QRS Duration: 13ms  QTc: 48ms  Conduction Disturbances: none  Other Abnormalities: none  Patient's cardiac risk factors are: advanced age (older than 35 for men, 8 for women) and sedentary lifestyle.  EKG comparison: none  Narrative Interpretation: PACs     Assessment:   Encounter Diagnoses  Name Primary?  . Medicare annual wellness visit, subsequent Yes  . Allergic rhinitis due to pollen, unspecified chronicity, unspecified seasonality   . Lumbar back pain with radiculopathy affecting right lower extremity   . Osteoporosis, unspecified osteoporosis type, unspecified pathological fracture presence   . DDD (degenerative disc disease), cervical   . History of breast cancer in female   . Dysthymia   . Vulvodynia   . Insomnia, unspecified type   . Chronic neck pain   . Vitamin D deficiency   . Need for prophylactic vaccination and inoculation against influenza   . Vaccine counseling   . Special screening for malignant neoplasms, colon   . Screening for cervical cancer   . Elevated cholesterol   . Hearing loss, unspecified hearing loss type, unspecified laterality   . Dizziness and giddiness   . Estrogen deficiency   . Screening for breast cancer   . Skin lesions   . Tongue lesion       Plan:   A preventative services visit was completed today.  During the course of the visit today, we discussed and counseled about appropriate screening and preventive services.  A health risk assessment was established today that included a review of current medications, allergies, social history, family history, medical and preventative health history, biometrics, and preventative screenings to identify potential safety concerns or impairments.  A personalized plan was printed today for your records and  use.   Personalized health advice and education was given today to reduce health risks and promote self management and wellness.  Information regarding end of life planning was discussed today.  Chronic problems and conditions discussed today: Osteoporosis - discussed need for aerobic and weight bearing exercise, healthy diet, repeat bone density scan Need to exercise regularly, eat a healthy low fat  diet, get more water intake Vit D deficiency - labs today Prior labs with elevated cholesterol - labs today Insomnia - c/t good sleep hygiene Glad to hear she is abstinence from alcohol Chronic neck pain - advised routine stretching, exercise Vulvodynia - declines gyn eval, routine labs today Skin lesions - consider dermatology referral for surveillance  Acute problems discussed today: none  Recommendations:  I recommend a yearly ophthalmology/optometry visit for glaucoma screening and eye checkup  I recommended a yearly dental visit for hygiene and checkup.  She is way past due for this  Advanced directives -  get [place] a copy   I recommend a screening mammogram every year  We are referring you for updated colonoscopy  Your last bone density screen was 2014.  I recommend you have a repeat bone density screen at this time  Labs today   Discussed EKG findings.   discussed symptoms that would prompt further eval, or if she plans to significantly increase exercise regimen, consider baseline cardiology eval  Referrals today: GI for colonoscopy  Immunizations: I recommended a yearly influenza vaccine, typically in September when the vaccine is usually available Counseled on the influenza virus vaccine.  Vaccine information sheet given.  Influenza vaccine given after consent obtained. Is the Pneumococcal vaccine up to date: yes. Is the Shingles vaccine up to date: yes.   Is the Td/Tdap vaccine up to date: yes.  Anayancy was seen today for medicare wellness.  Diagnoses and all orders  for this visit:  Medicare annual wellness visit, subsequent  Allergic rhinitis due to pollen, unspecified chronicity, unspecified seasonality  Lumbar back pain with radiculopathy affecting right lower extremity -     CBC  Osteoporosis, unspecified osteoporosis type, unspecified pathological fracture presence -     Comprehensive metabolic panel -     TSH -     VITAMIN D 25 Hydroxy (Vit-D Deficiency, Fractures) -     DG Bone Density; Future  DDD (degenerative disc disease), cervical -     Comprehensive metabolic panel  History of breast cancer in female  Dysthymia -     Comprehensive metabolic panel  Vulvodynia -     CA 125  Insomnia, unspecified type  Chronic neck pain  Vitamin D deficiency -     TSH -     VITAMIN D 25 Hydroxy (Vit-D Deficiency, Fractures) -     DG Bone Density; Future  Need for prophylactic vaccination and inoculation against influenza -     Flu vaccine HIGH DOSE PF (Fluzone High dose)  Vaccine counseling  Special screening for malignant neoplasms, colon -     CBC -     Comprehensive metabolic panel -     Ambulatory referral to Gastroenterology  Screening for cervical cancer -     CA 125  Elevated cholesterol -     Lipid panel  Hearing loss, unspecified hearing loss type, unspecified laterality  Dizziness and giddiness -     EKG 12-Lead  Estrogen deficiency -     DG Bone Density; Future  Screening for breast cancer -     MM DIGITAL SCREENING BILATERAL; Future  Skin lesions  Tongue lesion    Medicare Attestation A preventative services visit was completed today.  During the course of the visit the patient was educated and counseled about appropriate screening and preventive services.  A health risk assessment was established with the patient that included a review of current medications, allergies, social history, family history, medical and preventative health  history, biometrics, and preventative screenings to identify potential  safety concerns or impairments.  A personalized plan was printed today for the patient's records and use.   Personalized health advice and education was given today to reduce health risks and promote self management and wellness.  Information regarding end of life planning was discussed today.  Crisoforo Oxford, PA-C   06/19/2016

## 2016-06-19 NOTE — Patient Instructions (Addendum)
  A preventative services visit was completed today.  During the course of the visit today, we discussed and counseled about appropriate screening and preventive services.  A health risk assessment was established today that included a review of current medications, allergies, social history, family history, medical and preventative health history, biometrics, and preventative screenings to identify potential safety concerns or impairments.  A personalized plan was printed today for your records and use.   Personalized health advice and education was given today to reduce health risks and promote self management and wellness.  Information regarding end of life planning was discussed today.  Chronic problems and conditions discussed today: Osteoporosis - schedule and updated bone density screen.   Continue Fosamax weekly and OTC vitamin D 1000 IU daily    Exercise most days of the week.  Eat a healthy low fat diet, get plenty of fruits, vegetables and water  Skin lesions - consider dermatology referral for surveillance   Recommendations:  I recommend a yearly ophthalmology/optometry visit for glaucoma screening and eye checkup  I recommended a yearly dental visit for hygiene and checkup.  She is way past due for this  Advanced directives -  get [place] a copy if you haven't done so  I recommend a screening mammogram every year  We are referring you for updated colonoscopy  Your last bone density screen was 2014.  I recommend you have a repeat bone density screen at this time  Labs today   Referrals today: GI for colonoscopy  Immunizations: I recommended a yearly influenza vaccine, typically in September when the vaccine is usually available Is the Pneumococcal vaccine up to date: yes. Is the Shingles vaccine up to date: yes.   Is the Td/Tdap vaccine up to date: yes.   I recommend the following dentist:  Dr. Jonna Coup, dentist [address], [place], [place] [address] [phone] [email or web address]   I recommend the following eye doctor:s [facility] Dr. Camillo Flaming [address], [place], [place] [address]  [email or web address]  Fabio Pierce, M.D. Corena Herter, O.D. [place], [place], [place] [address] Medical telephone: [phone] Optical telephone: [phone]

## 2016-06-20 ENCOUNTER — Other Ambulatory Visit: Payer: Self-pay | Admitting: Medical

## 2016-06-20 LAB — CA 125: CA 125: 4 U/mL (ref <35)

## 2016-06-20 LAB — VITAMIN D 25 HYDROXY (VIT D DEFICIENCY, FRACTURES): Vit D, 25-Hydroxy: 28 ng/mL — ABNORMAL LOW (ref 30–100)

## 2016-06-20 MED ORDER — FLUOXETINE HCL 40 MG PO CAPS: 40.0000 mg | ORAL_CAPSULE | Freq: Every day | ORAL | 3 refills | Status: DC

## 2016-06-20 MED ORDER — ALENDRONATE SODIUM 70 MG PO TABS
70.0000 mg | ORAL_TABLET | ORAL | 3 refills | Status: DC
Start: 2016-06-20 — End: 2017-03-05

## 2016-06-20 MED ORDER — ASPIRIN 81 MG PO TABS: 81.0000 mg | ORAL_TABLET | Freq: Every day | ORAL | 3 refills | Status: DC

## 2016-06-25 ENCOUNTER — Telehealth: Payer: Self-pay | Admitting: Internal Medicine

## 2016-06-25 NOTE — Telephone Encounter (Signed)
Colon report printed from epic and placed on Dr.Pyrtle desk for review. Dr.Pyrtle DOD

## 2016-06-28 ENCOUNTER — Encounter: Payer: Self-pay | Admitting: Medical

## 2016-06-28 NOTE — Telephone Encounter (Signed)
Dr. Hilarie Fredrickson reviewed records and has accepted patient. Per Dr. Hilarie Fredrickson, " due if confirmed family hx of colon cancer in 1st degree relative or 2nd degree relatives. If so, okay to schedule screening colon". Patient states that she is driving and will callback to discuss this.

## 2016-06-30 ENCOUNTER — Telehealth: Payer: Self-pay | Admitting: Medical

## 2016-06-30 NOTE — Telephone Encounter (Signed)
pls refer for Cologuard test

## 2016-07-03 NOTE — Telephone Encounter (Signed)
done

## 2016-07-10 DIAGNOSIS — Z1212 Encounter for screening for malignant neoplasm of rectum: Secondary | ICD-10-CM | POA: Diagnosis not present

## 2016-07-10 DIAGNOSIS — Z1211 Encounter for screening for malignant neoplasm of colon: Secondary | ICD-10-CM | POA: Diagnosis not present

## 2016-07-23 ENCOUNTER — Telehealth: Payer: Self-pay | Admitting: Medical

## 2016-07-23 DIAGNOSIS — R195 Other fecal abnormalities: Secondary | ICD-10-CM

## 2016-07-23 NOTE — Telephone Encounter (Signed)

## 2016-07-23 NOTE — Telephone Encounter (Signed)
Unfortunately Cologuard test result was positive.  This can indicate the presence of colorectal cancer or bleeding.   Thus, I recommend referral to gastroenterology at this time.   Please refer to gastroenterology for further evaluations.   (see if they have seen GI prior or have a preference for referral).    

## 2016-07-23 NOTE — Telephone Encounter (Signed)
Spoke with pt- notified of results. Order entered for GI appt. Pt states that she has seen Dr. Collene Mares in the past. Called Dr. Lorie Apley office and they are not able to schedule pt d/t pt transferring out to [facility]. Upon further review of chart, note in system stating she can have colonoscopy by Dr. Hilarie Fredrickson. She will call [facility] to schedule this. She states she will call tomorrow. Victorino December

## 2016-07-23 NOTE — Telephone Encounter (Signed)
This encounter was created in error - please disregard.

## 2016-07-23 NOTE — Telephone Encounter (Signed)
LMTCB

## 2016-07-25 ENCOUNTER — Encounter: Payer: Self-pay | Admitting: Internal Medicine

## 2016-07-25 ENCOUNTER — Encounter: Payer: Self-pay | Admitting: Medical

## 2016-07-26 ENCOUNTER — Other Ambulatory Visit: Payer: Self-pay

## 2016-07-26 ENCOUNTER — Telehealth: Payer: Self-pay | Admitting: Medical

## 2016-07-26 MED ORDER — ZOLPIDEM TARTRATE 5 MG PO TABS: 5.0000 mg | ORAL_TABLET | Freq: Every evening | ORAL | 1 refills | Status: DC | PRN

## 2016-07-26 NOTE — Telephone Encounter (Signed)
error 

## 2016-07-26 NOTE — Telephone Encounter (Signed)
Please call out and electronically order for documentation Ambien for #[phone] with refill

## 2016-07-26 NOTE — Telephone Encounter (Signed)
Called ambien in per [employer]

## 2016-07-26 NOTE — Telephone Encounter (Signed)
This has been called in

## 2016-08-02 ENCOUNTER — Encounter: Payer: Self-pay | Admitting: Medical

## 2016-08-27 HISTORY — PX: COLONOSCOPY: SHX174

## 2016-09-10 ENCOUNTER — Ambulatory Visit (AMBULATORY_SURGERY_CENTER): Payer: Self-pay | Admitting: *Deleted

## 2016-09-10 VITALS — Ht 67.0 in | Wt 159.6 lb

## 2016-09-10 DIAGNOSIS — Z800 Family history of malignant neoplasm of digestive organs: Secondary | ICD-10-CM

## 2016-09-10 MED ORDER — NA SULFATE-K SULFATE-MG SULF 17.5-3.13-1.6 GM/177ML PO SOLN: 1.0000 | Freq: Once | ORAL | 0 refills | Status: AC

## 2016-09-10 NOTE — Progress Notes (Signed)
PV done by A Lewellyn

## 2016-09-12 ENCOUNTER — Encounter: Payer: Self-pay | Admitting: Internal Medicine

## 2016-09-24 ENCOUNTER — Ambulatory Visit (AMBULATORY_SURGERY_CENTER): Payer: Medicare HMO | Admitting: Internal Medicine

## 2016-09-24 ENCOUNTER — Encounter: Payer: Self-pay | Admitting: Internal Medicine

## 2016-09-24 VITALS — BP 122/72 | HR 73 | Temp 99.3°F | Resp 15 | Ht 67.0 in | Wt 159.0 lb

## 2016-09-24 DIAGNOSIS — D123 Benign neoplasm of transverse colon: Secondary | ICD-10-CM

## 2016-09-24 DIAGNOSIS — D122 Benign neoplasm of ascending colon: Secondary | ICD-10-CM | POA: Diagnosis not present

## 2016-09-24 DIAGNOSIS — Z1211 Encounter for screening for malignant neoplasm of colon: Secondary | ICD-10-CM | POA: Diagnosis not present

## 2016-09-24 DIAGNOSIS — Z800 Family history of malignant neoplasm of digestive organs: Secondary | ICD-10-CM | POA: Diagnosis present

## 2016-09-24 DIAGNOSIS — Z1212 Encounter for screening for malignant neoplasm of rectum: Secondary | ICD-10-CM

## 2016-09-24 DIAGNOSIS — D124 Benign neoplasm of descending colon: Secondary | ICD-10-CM | POA: Diagnosis not present

## 2016-09-24 MED ORDER — SODIUM CHLORIDE 0.9 % IV SOLN: 500.0000 mL | INTRAVENOUS | Status: DC

## 2016-09-24 NOTE — Progress Notes (Signed)
No problems noted in the recovery room. maw 

## 2016-09-24 NOTE — Patient Instructions (Signed)
YOU HAD AN ENDOSCOPIC PROCEDURE TODAY AT [facility]:   Refer to the procedure report that was given to you for any specific questions about what was found during the examination.  If the procedure report does not answer your questions, please call your gastroenterologist to clarify.  If you requested that your care partner not be given the details of your procedure findings, then the procedure report has been included in a sealed envelope for you to review at your convenience later.  YOU SHOULD EXPECT: Some feelings of bloating in the abdomen. Passage of more gas than usual.  Walking can help get rid of the air that was put into your GI tract during the procedure and reduce the bloating. If you had a lower endoscopy (such as a colonoscopy or flexible sigmoidoscopy) you may notice spotting of blood in your stool or on the toilet paper. If you underwent a bowel prep for your procedure, you may not have a normal bowel movement for a few days.  Please Note:  You might notice some irritation and congestion in your nose or some drainage.  This is from the oxygen used during your procedure.  There is no need for concern and it should clear up in a day or so.  SYMPTOMS TO REPORT IMMEDIATELY:   Following lower endoscopy (colonoscopy or flexible sigmoidoscopy):  Excessive amounts of blood in the stool  Significant tenderness or worsening of abdominal pains  Swelling of the abdomen that is new, acute  Fever of 100F or higher   Following upper endoscopy (EGD)  Vomiting of blood or coffee ground material  New chest pain or pain under the shoulder blades  Painful or persistently difficult swallowing  New shortness of breath  Fever of 100F or higher  Black, tarry-looking stools  For urgent or emergent issues, a gastroenterologist can be reached at any hour by calling [phone].   DIET:  We do recommend a small meal at first, but then you may proceed to your regular diet.  Drink  plenty of fluids but you should avoid alcoholic beverages for 24 hours.  ACTIVITY:  You should plan to take it easy for the rest of today and you should NOT DRIVE or use heavy machinery until tomorrow (because of the sedation medicines used during the test).    FOLLOW UP: Our staff will call the number listed on your records the next business day following your procedure to check on you and address any questions or concerns that you may have regarding the information given to you following your procedure. If we do not reach you, we will leave a message.  However, if you are feeling well and you are not experiencing any problems, there is no need to return our call.  We will assume that you have returned to your regular daily activities without incident.  If any biopsies were taken you will be contacted by phone or by letter within the next 1-3 weeks.  Please call us at [phone] if you have not heard about the biopsies in 3 weeks.    SIGNATURES/CONFIDENTIALITY: You and/or your care partner have signed paperwork which will be entered into your electronic medical record.  These signatures attest to the fact that that the information above on your After Visit Summary has been reviewed and is understood.  Full responsibility of the confidentiality of this discharge information lies with you and/or your care-partner.     Handouts were given to your care partner on  hemorrhoids, and a high fiber diet with liberal fluid intake. You may resume your current medications today. Await biopsy results. Please call if any questions or concerns.   

## 2016-09-24 NOTE — Progress Notes (Signed)
Called to room to assist during endoscopic procedure.  Patient ID and intended procedure confirmed with present staff. Received instructions for my participation in the procedure from the performing physician.  

## 2016-09-24 NOTE — Op Note (Signed)
[facility] Patient Name: Emily Cowan Procedure Date: 09/24/2016 1:17 PM MRN: [id number] Endoscopist: Jerene Bears , MD Age: 71 Referring MD:  Date of Birth: June 05, 1946 Gender: Female Account #: [id number] Procedure:                Colonoscopy Indications:              Screening in patient at increased risk: Family                            history of 1st-degree relative with colorectal                            cancer, Last colonoscopy: 2011 Medicines:                Monitored Anesthesia Care Procedure:                Pre-Anesthesia Assessment:                           - Prior to the procedure, a History and Physical                            was performed, and patient medications and                            allergies were reviewed. The patient's tolerance of                            previous anesthesia was also reviewed. The risks                            and benefits of the procedure and the sedation                            options and risks were discussed with the patient.                            All questions were answered, and informed consent                            was obtained. Prior Anticoagulants: The patient has                            taken no previous anticoagulant or antiplatelet                            agents. ASA Grade Assessment: II - A patient with                            mild systemic disease. After reviewing the risks                            and benefits, the patient was deemed in  satisfactory condition to undergo the procedure.                           After obtaining informed consent, the colonoscope                            was passed under direct vision. Throughout the                            procedure, the patient's blood pressure, pulse, and                            oxygen saturations were monitored continuously. The                            Model PCF-H190L [id number]) scope was  introduced                            through the anus and advanced to the the cecum,                            identified by appendiceal orifice and ileocecal                            valve. The colonoscopy was performed without                            difficulty. The patient tolerated the procedure                            well. The quality of the bowel preparation was                            good. The ileocecal valve, appendiceal orifice, and                            rectum were photographed. Scope In: 1:54:54 PM Scope Out: 2:15:08 PM Scope Withdrawal Time: 0 hours 14 minutes 29 seconds  Total Procedure Duration: 0 hours 20 minutes 14 seconds  Findings:                 The digital rectal exam was normal.                           Two sessile polyps were found in the ascending                            colon. The polyps were 3 to 4 mm in size. These                            polyps were removed with a cold snare. Resection                            and retrieval were complete.  A 8 mm polyp was found in the transverse colon. The                            polyp was sessile. The polyp was removed with a                            cold snare. Resection and retrieval were complete.                           A 3 mm polyp was found in the transverse colon. The                            polyp was sessile. The polyp was removed with a                            cold snare. Resection and retrieval were complete.                           Multiple small and large-mouthed diverticula were                            found in the sigmoid colon.                           Internal hemorrhoids were found during                            retroflexion. The hemorrhoids were small. Complications:            No immediate complications. Estimated Blood Loss:     Estimated blood loss was minimal. Impression:               - Two 3 to 4 mm polyps in the ascending  colon,                            removed with a cold snare. Resected and retrieved.                           - One 8 mm polyp in the transverse colon, removed                            with a cold snare. Resected and retrieved.                           - One 3 mm polyp in the transverse colon, removed                            with a cold snare. Resected and retrieved.                           - Moderate diverticulosis in the sigmoid colon.                           -  Internal hemorrhoids. Recommendation:           - Patient has a contact number available for                            emergencies. The signs and symptoms of potential                            delayed complications were discussed with the                            patient. Return to normal activities tomorrow.                            Written discharge instructions were provided to the                            patient.                           - Resume previous diet.                           - Continue present medications.                           - Await pathology results.                           - Repeat colonoscopy is recommended for                            surveillance. The colonoscopy date will be                            determined after pathology results from today's                            exam become available for review. Jerene Bears, MD 09/24/2016 2:23:34 PM This report has been signed electronically.

## 2016-09-24 NOTE — Progress Notes (Signed)
Spontaneous respirations throughout. VSS. Resting comfortably. To PACU on room air. Report to  Annette RN.  

## 2016-09-25 ENCOUNTER — Telehealth: Payer: Self-pay | Admitting: *Deleted

## 2016-09-25 NOTE — Telephone Encounter (Signed)
  Follow up Call-  Call back number 09/24/2016  Post procedure Call Back phone  # [phone]  Permission to leave phone message Yes  Some recent data might be hidden     Patient questions:  Do you have a fever, pain , or abdominal swelling? No. Pain Score  0 *  Have you tolerated food without any problems? Yes.    Have you been able to return to your normal activities? Yes.    Do you have any questions about your discharge instructions: Diet   No. Medications  No. Follow up visit  No.  Do you have questions or concerns about your Care? No.  Actions: * If pain score is 4 or above: No action needed, pain <4.

## 2016-10-02 ENCOUNTER — Encounter: Payer: Self-pay | Admitting: Internal Medicine

## 2016-12-14 ENCOUNTER — Encounter: Payer: Self-pay | Admitting: Medical

## 2016-12-14 ENCOUNTER — Other Ambulatory Visit: Payer: Self-pay | Admitting: Medical

## 2017-01-08 ENCOUNTER — Ambulatory Visit
Admission: RE | Admit: 2017-01-08 | Discharge: 2017-01-08 | Disposition: A | Discharge status: Home / Self Care | Payer: Medicare HMO | Source: Ambulatory Visit | Attending: Medical | Admitting: Medical

## 2017-01-08 ENCOUNTER — Other Ambulatory Visit: Payer: Self-pay | Admitting: Medical

## 2017-01-08 DIAGNOSIS — Z1239 Encounter for other screening for malignant neoplasm of breast: Secondary | ICD-10-CM

## 2017-01-08 DIAGNOSIS — E559 Vitamin D deficiency, unspecified: Secondary | ICD-10-CM

## 2017-01-08 DIAGNOSIS — M810 Age-related osteoporosis without current pathological fracture: Secondary | ICD-10-CM | POA: Diagnosis not present

## 2017-01-08 DIAGNOSIS — Z780 Asymptomatic menopausal state: Secondary | ICD-10-CM | POA: Diagnosis not present

## 2017-01-08 DIAGNOSIS — E2839 Other primary ovarian failure: Secondary | ICD-10-CM

## 2017-01-08 DIAGNOSIS — Z1231 Encounter for screening mammogram for malignant neoplasm of breast: Secondary | ICD-10-CM | POA: Diagnosis not present

## 2017-01-10 ENCOUNTER — Encounter: Payer: Self-pay | Admitting: Medical

## 2017-01-14 ENCOUNTER — Other Ambulatory Visit: Payer: Self-pay | Admitting: Medical

## 2017-01-14 MED ORDER — IBANDRONATE SODIUM 150 MG PO TABS: 150.0000 mg | ORAL_TABLET | ORAL | 3 refills | Status: DC

## 2017-01-14 NOTE — Progress Notes (Unsigned)
iba

## 2017-03-04 ENCOUNTER — Encounter: Payer: Self-pay | Admitting: Medical

## 2017-03-05 ENCOUNTER — Telehealth: Payer: Self-pay | Admitting: Medical

## 2017-03-05 ENCOUNTER — Other Ambulatory Visit: Payer: Self-pay

## 2017-03-05 ENCOUNTER — Other Ambulatory Visit: Payer: Self-pay | Admitting: Medical

## 2017-03-05 MED ORDER — IBANDRONATE SODIUM 150 MG PO TABS: 150.0000 mg | ORAL_TABLET | ORAL | 3 refills | Status: DC

## 2017-03-05 NOTE — Telephone Encounter (Signed)
Sent refill to [facility]

## 2017-03-05 NOTE — Telephone Encounter (Signed)
Called pt she uses [facility]

## 2017-03-05 NOTE — Telephone Encounter (Signed)
Call in [place] to mail order.  She sent me email, but there are 2 mail order pharmacies listed, not sure which one is correct.

## 2017-03-15 ENCOUNTER — Encounter: Payer: Self-pay | Admitting: Medical

## 2017-03-19 ENCOUNTER — Telehealth: Payer: Self-pay | Admitting: Medical

## 2017-03-19 ENCOUNTER — Other Ambulatory Visit: Payer: Self-pay | Admitting: Medical

## 2017-03-19 MED ORDER — IBANDRONATE SODIUM 150 MG PO TABS: 150.0000 mg | ORAL_TABLET | ORAL | 3 refills | Status: DC

## 2017-03-19 NOTE — Telephone Encounter (Signed)
She emailed me again about Boniva.  She said the [facility] still hasn't gotten refill order for Boniva.  The last telephone message in the chart shows we called it in.   Please check on this.

## 2017-03-20 ENCOUNTER — Encounter: Payer: Self-pay | Admitting: Medical

## 2017-03-20 ENCOUNTER — Other Ambulatory Visit: Payer: Self-pay

## 2017-03-20 MED ORDER — IBANDRONATE SODIUM 150 MG PO TABS: 150.0000 mg | ORAL_TABLET | ORAL | 3 refills | Status: AC

## 2017-03-20 NOTE — Telephone Encounter (Signed)
Resent med to [facility] it was sent to [facility]

## 2017-03-25 ENCOUNTER — Encounter: Payer: Self-pay | Admitting: Medical

## 2017-03-29 ENCOUNTER — Telehealth: Payer: Self-pay

## 2017-03-29 NOTE — Telephone Encounter (Signed)
Called in to pharmacy to [facility]

## 2017-03-29 NOTE — Telephone Encounter (Signed)
Pt states [employer] has not rcvd her Boniva. Please call into [place].  Thank you, Wells Guiles

## 2017-06-20 ENCOUNTER — Encounter: Payer: Self-pay | Admitting: Medical

## 2017-06-20 ENCOUNTER — Ambulatory Visit (INDEPENDENT_AMBULATORY_CARE_PROVIDER_SITE_OTHER): Payer: Medicare HMO | Admitting: Medical

## 2017-06-20 ENCOUNTER — Other Ambulatory Visit: Payer: Self-pay | Admitting: Medical

## 2017-06-20 VITALS — BP 114/68 | HR 84 | Ht 66.5 in | Wt 156.8 lb

## 2017-06-20 DIAGNOSIS — Z23 Encounter for immunization: Secondary | ICD-10-CM | POA: Diagnosis not present

## 2017-06-20 DIAGNOSIS — E559 Vitamin D deficiency, unspecified: Secondary | ICD-10-CM

## 2017-06-20 DIAGNOSIS — M810 Age-related osteoporosis without current pathological fracture: Secondary | ICD-10-CM

## 2017-06-20 DIAGNOSIS — F341 Dysthymic disorder: Secondary | ICD-10-CM

## 2017-06-20 DIAGNOSIS — E2839 Other primary ovarian failure: Secondary | ICD-10-CM

## 2017-06-20 DIAGNOSIS — E7800 Pure hypercholesterolemia, unspecified: Secondary | ICD-10-CM

## 2017-06-20 DIAGNOSIS — G8929 Other chronic pain: Secondary | ICD-10-CM

## 2017-06-20 DIAGNOSIS — N94819 Vulvodynia, unspecified: Secondary | ICD-10-CM

## 2017-06-20 DIAGNOSIS — K529 Noninfective gastroenteritis and colitis, unspecified: Secondary | ICD-10-CM | POA: Diagnosis not present

## 2017-06-20 DIAGNOSIS — Z1239 Encounter for other screening for malignant neoplasm of breast: Secondary | ICD-10-CM

## 2017-06-20 DIAGNOSIS — M5416 Radiculopathy, lumbar region: Secondary | ICD-10-CM

## 2017-06-20 DIAGNOSIS — R251 Tremor, unspecified: Secondary | ICD-10-CM | POA: Insufficient documentation

## 2017-06-20 DIAGNOSIS — Z0000 Encounter for general adult medical examination without abnormal findings: Secondary | ICD-10-CM

## 2017-06-20 DIAGNOSIS — M542 Cervicalgia: Secondary | ICD-10-CM

## 2017-06-20 DIAGNOSIS — K219 Gastro-esophageal reflux disease without esophagitis: Secondary | ICD-10-CM | POA: Insufficient documentation

## 2017-06-20 DIAGNOSIS — Z7189 Other specified counseling: Secondary | ICD-10-CM

## 2017-06-20 DIAGNOSIS — M5030 Other cervical disc degeneration, unspecified cervical region: Secondary | ICD-10-CM

## 2017-06-20 DIAGNOSIS — J301 Allergic rhinitis due to pollen: Secondary | ICD-10-CM

## 2017-06-20 DIAGNOSIS — G4700 Insomnia, unspecified: Secondary | ICD-10-CM

## 2017-06-20 DIAGNOSIS — Z853 Personal history of malignant neoplasm of breast: Secondary | ICD-10-CM

## 2017-06-20 DIAGNOSIS — H9190 Unspecified hearing loss, unspecified ear: Secondary | ICD-10-CM

## 2017-06-20 DIAGNOSIS — Z7185 Encounter for immunization safety counseling: Secondary | ICD-10-CM

## 2017-06-20 MED ORDER — DEXLANSOPRAZOLE 60 MG PO CPDR: 60.0000 mg | DELAYED_RELEASE_CAPSULE | Freq: Every day | ORAL | 0 refills | Status: DC

## 2017-06-20 MED ORDER — ELUXADOLINE 75 MG PO TABS: 1.0000 | ORAL_TABLET | Freq: Every day | ORAL | 0 refills | Status: DC

## 2017-06-20 MED ORDER — HYDROCODONE-ACETAMINOPHEN 5-325 MG PO TABS: 1.0000 | ORAL_TABLET | Freq: Four times a day (QID) | ORAL | 0 refills | Status: DC | PRN

## 2017-06-20 NOTE — Patient Instructions (Signed)
Recommendations  See your eye doctor yearly for routine vision care.  See your dentist yearly for routine dental care including hygiene visits twice yearly.  Schedule a mammogram  I recommend you have a shingles vaccine to help prevent shingles or herpes zoster outbreak.   Please call your insurer to inquire about coverage for the Shingrix vaccine given in 2 doses.   Some insurers cover this vaccine after age 71, some cover this after age 44.  If your insurer covers this, then call to schedule appointment to have this vaccine here.  Begin trial of Viberzi tablet once daily in the morning to see if this helps with diarrhea.  You could alternatively try a week of this at bedtime once daily to see which works better  Begin short term trial of Dexilant 1 capsule daily for reflux  You can use the Hydrocodone/norco pain medication sparingly for back pain flare up  For lesser pain flare ups use over the counter Tylenol or Ibuprofen   Preferably don't take a pain pill or arthritis pill EVERY day   Encounter Diagnoses  Name Primary?  . Chronic diarrhea Yes  . Medicare annual wellness visit, subsequent   . Need for influenza vaccination   . Shakiness   . Insomnia, unspecified type   . Screening for breast cancer   . Need for prophylactic vaccination and inoculation against influenza   . Vulvodynia   . Vitamin D deficiency   . Vaccine counseling   . History of breast cancer in female   . Estrogen deficiency   . Elevated cholesterol   . Dysthymia   . Chronic neck pain   . Osteoporosis, unspecified osteoporosis type, unspecified pathological fracture presence   . DDD (degenerative disc disease), cervical   . Lumbar back pain with radiculopathy affecting right lower extremity   . Hearing loss, unspecified hearing loss type, unspecified laterality   . Allergic rhinitis due to pollen, unspecified seasonality   . Gastroesophageal reflux disease without esophagitis

## 2017-06-20 NOTE — Progress Notes (Signed)
Subjective:    Emily Cowan is a 71 y.o. female who presents for Preventative Services visit and chronic medical problems/med check visit.    Primary Care Provider Lev Cervone, Camelia Eng, PA-C here for primary care  Current Health Care Team:  Dentist, Dr. none  Eye doctor, Dr. Vinetta Bergamo  Medical Services you may have received from other than [facility] providers in the past year (date may be approximate) none  Exercise Current exercise habits: yard work, some walking as well.    just recently went to [place] for 2 weeks.4  Nutrition/Diet Current diet: variety of foods, not a lot of spicy acidic foods.  Depression Screen Depression screen [facility] 2/9 06/20/2017  Decreased Interest 0  Down, Depressed, Hopeless 0  PHQ - 2 Score 0  Altered sleeping -  Tired, decreased energy -  Change in appetite -  Feeling bad or failure about yourself  -  Trouble concentrating -  Moving slowly or fidgety/restless -  Suicidal thoughts -  PHQ-9 Score -    Activities of Daily Living Screen/Functional Status Survey Is the patient deaf or have difficulty hearing?: Yes (hearing aides ) Does the patient have difficulty seeing, even when wearing glasses/contacts?: No Does the patient have difficulty concentrating, remembering, or making decisions?: No Does the patient have difficulty walking or climbing stairs?: No Does the patient have difficulty dressing or bathing?: No Does the patient have difficulty doing errands alone such as visiting a doctor's office or shopping?: No  Can patient draw a clock face showing 3:15 o'clock  Fall Risk Screen Fall Risk  06/20/2017 06/19/2016 06/01/2015 04/07/2014  Falls in the past year? No Yes No No  Number falls in past yr: - 1 - -  Injury with Fall? - Yes - -  Risk for fall due to : - Impaired balance/gait - -    Gait Assessment: Normal gait observed yes  Advanced directives Does patient have a [place]? Yes Does patient have  a Living Will? Yes  Past Medical History:  Diagnosis Date  . Alcohol abuse   . Allergic rhinitis   . Cancer ([place]) 2002   BREAST  . Depression   . Family history of cancer    multiple  . Hearing impaired    wears hearing aids  . Hemangioma of liver 2005   MRI   . Osteoporosis   . Seborrheic dermatitis   . Vulvodynia    longstanding, failed Premarin cream; can't tolerate pelvic exams  . Wears glasses     Past Surgical History:  Procedure Laterality Date  . COLONOSCOPY  2011   in [facility]  . MASTECTOMY  2002   right  . TONSILLECTOMY    . WISDOM TOOTH EXTRACTION      Social History   Social History  . Marital status: Single    Spouse name: N/A  . Number of children: N/A  . Years of education: N/A   Occupational History  . Not on file.   Social History Main Topics  . Smoking status: Former Smoker    Quit date: 1992  . Smokeless tobacco: Never Used  . Alcohol use No  . Drug use: No  . Sexual activity: Not Currently   Other Topics Concern  . Not on file   Social History Narrative   Single, no children, retired, lesbian but no current relationship; exercise- working in the yard, cutting down trees, Biomedical scientist, walking the dogs.  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 Cancer Mother        GI cancer  . Cancer Father        liver  . Stroke Father   . Cancer Sister        vulvar  . Cancer Brother        liver  . Cancer Brother        lung  . Diabetes Neg Hx   . Heart disease Neg Hx   . Hypertension Neg Hx   . Hyperlipidemia Neg Hx   . Breast cancer Neg Hx      Current Outpatient Prescriptions:  .  aspirin 81 MG tablet, Take 1 tablet (81 mg total) by mouth daily., Disp: 90 tablet, Rfl: 3 .  calcium carbonate (OS-CAL) 600 MG TABS tablet, Take 600 mg by mouth daily with breakfast., Disp: , Rfl:  .  FLUoxetine (PROZAC) 40 MG capsule, Take 1 capsule (40 mg total) by mouth daily., Disp: 90 capsule, Rfl: 3 .   Glucosamine-Chondroit-Vit C-Mn (GLUCOSAMINE CHONDR 1500 COMPLX) CAPS, Take 1 capsule by mouth daily.  , Disp: , Rfl:  .  ibandronate (BONIVA) 150 MG tablet, Take 1 tablet (150 mg total) by mouth every 30 (thirty) days., Disp: 3 tablet, Rfl: 3 .  ibuprofen (ADVIL,MOTRIN) 800 MG tablet, Take 1 tablet (800 mg total) by mouth every 8 (eight) hours as needed., Disp: 30 tablet, Rfl: 0 .  Multiple Vitamins-Minerals (MULTIVITAMIN WITH MINERALS) tablet, Take 1 tablet by mouth daily.  , Disp: , Rfl:  .  zolpidem (AMBIEN) 5 MG tablet, Take 1 tablet (5 mg total) by mouth at bedtime as needed. for sleep, Disp: 90 tablet, Rfl: 1 .  dexlansoprazole (DEXILANT) 60 MG capsule, Take 1 capsule (60 mg total) by mouth daily., Disp: 14 capsule, Rfl: 0 .  Eluxadoline (VIBERZI) 75 MG TABS, Take 1 tablet by mouth daily., Disp: 16 tablet, Rfl: 0 .  HYDROcodone-acetaminophen (NORCO) 5-325 MG tablet, Take 1 tablet by mouth every 6 (six) hours as needed for moderate pain., Disp: 30 tablet, Rfl: 0  Current Facility-Administered Medications:  .  0.9 %  sodium chloride infusion, 500 mL, Intravenous, Continuous, Pyrtle, Lajuan Lines, MD  No Known Allergies  History reviewed: allergies, current medications, past family history, past medical history, past social history, past surgical history and problem list  Chronic issues discussed: Osteoporosis - compliant with boniva  Vit D deficiency - not taking Vit D.  Not as active as in the past.    Has had problems with diarrhea most of her life, starting to bother her more of late.  She notes having colonoscopy 08/2016.   Gets loose stools about every day.  No blood in stool, more watery stools than other.  Use to attribute to alcohol, but no drinking much of late.  Generally has loose stools in the morning, not so much other times of day.     Does get occasionally sciatic flare up or back pain.  Likes to keep some pain medication non hand.   She doesn't use them often, but likes to keep  them on hand to use for flare up.  Tramadol doesn't help.   ibuprofen helps ok, but afraid to use this due to risks to kidneys.   Friends have recommend meloxicam but she read this was for OA specifically.  Has 2 old oxycodone tablets she has left over from prior flare up.   Acute issues Having some bad heartburn of late.  Been using some zantac OTC.  Can last for hours.  No SOB, no other chest pain, no pain worse with activity, no palpitations of heart.   No sweats.   Doesn't eat a lot of spicy of acidic foods.     Had a recent bad cold in September after returning from [place].   Every now and then feels shaky.  Wonders about glucose.    She wants flu shot.   Objective:   Biometrics BP 114/68   Pulse 84   Ht 5' 6.5" (1.689 m)   Wt 156 lb 12.8 oz (71.1 kg)   SpO2 97%   BMI 24.93 kg/m   Wt Readings from Last 3 Encounters:  06/20/17 156 lb 12.8 oz (71.1 kg)  09/24/16 159 lb (72.1 kg)  09/10/16 159 lb 9.6 oz (72.4 kg)    Cognitive Testing  Alert? Yes  Normal Appearance?Yes  Oriented to person? Yes  Place? Yes   Time? Yes  Recall of three objects?  Yes  Can perform simple calculations? Yes  Displays appropriate judgment?Yes  Can read the correct time from a watch face?Yes  General appearance: alert, no distress, WD/WN, white female  Nutritional Status: Inadequate calore intake? no Loss of muscle mass? yes Loss of fat beneath skin? yes Localized or general edema? no Diminished functional status? no  Other pertinent exam: Skin:   No specific worrisome lesions Various flat yellow brown lesions of upper and mid back suggestive of SKs bilat cheeks each with 2-3 mm brown roundish lesions unchanged per patient, likely solar keratoses HEENT: normocephalic, sclerae anicteric, TMs pearly, nares patent, no discharge or erythema, pharynx normal Oral cavity: MMM, left anterior tongue with 87mm raised somewhat squarish pale lesion unchanged for >1year,  teeth in good repair Neck:  supple, no lymphadenopathy, no thyromegaly, no masses Heart: RRR, normal S1, S2, no murmurs Lungs: CTA bilaterally, no wheezes, rhonchi, or rales Abdomen: +bs, soft, non tender, non distended, no masses, no hepatomegaly, no splenomegaly Musculoskeletal: limited exam, arms, legs nontender, no swelling, no obvious deformity Extremities: no edema, no cyanosis, no clubbing Pulses: 1+ symmetric, upper and lower extremities, normal cap refill Neurological: alert, oriented x 3, CN2-12 intact, strength normal upper extremities and lower extremities, sensation normal throughout, DTRs 2+ throughout, no cerebellar signs, gait normal Psychiatric: normal affect, behavior normal, pleasant  Declines pelvic exam, breast exam Declines rectal exam      Assessment:   Encounter Diagnoses  Name Primary?  . Chronic diarrhea Yes  . Medicare annual wellness visit, subsequent   . Need for influenza vaccination   . Shakiness   . Insomnia, unspecified type   . Screening for breast cancer   . Need for prophylactic vaccination and inoculation against influenza   . Vulvodynia   . Vitamin D deficiency   . Vaccine counseling   . History of breast cancer in female   . Estrogen deficiency   . Elevated cholesterol   . Dysthymia   . Chronic neck pain   . Osteoporosis, unspecified osteoporosis type, unspecified pathological fracture presence   . DDD (degenerative disc disease), cervical   . Lumbar back pain with radiculopathy affecting right lower extremity   . Hearing loss, unspecified hearing loss type, unspecified laterality   . Allergic rhinitis due to pollen, unspecified seasonality   . Gastroesophageal reflux disease without esophagitis       Plan:   A preventative services visit was completed today.  During the course of the visit today, we discussed and counseled about  appropriate screening and preventive services.  A health risk assessment was established today that included a review of current  medications, allergies, social history, family history, medical and preventative health history, biometrics, and preventative screenings to identify potential safety concerns or impairments.  A personalized plan was printed today for your records and use.   Personalized health advice and education was given today to reduce health risks and promote self management and wellness.  Information regarding end of life planning was discussed today.  Chronic problems and conditions discussed today: Osteoporosis - discussed need for aerobic and weight bearing exercise, healthy diet, c/t Boniva, changed from Fosamax this past year  Need to exercise regularly, eat a healthy low fat diet, get more water intake  Vit D deficiency - labs today, discussed need to get fish in diet, sun exposure, and 1000 IU Vit D daily  Prior labs with elevated cholesterol - labs today, likely will recommend statin  Insomnia - c/t good sleep hygiene, medication tolerated well  Glad to hear she is abstinence from alcohol  Chronic neck pain, chronic back pain, sciatica  - discussed medication for mild pain, worse pain.  Can use Norco and keep this on hand for flare ups, but can use Tylenol or Ibpuorfen OTC for less pain.  C/t exercise, weight bearing and aerobic, stretching.   Listen to body though and if pain worsening then recheck.  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    Advanced directives -  get [place] a copy   I recommend a screening mammogram every year  reviewed colonoscopy and bone density from 2017  Referrals today: none  Immunizations: I recommend you have a shingles vaccine  to help prevent shingles or herpes zoster outbreak.   Please call your insurer to inquire about coverage for the Shingrix vaccine given in 2 doses.   Some insurers cover this vaccine after age 51, some cover this after age 25.  If your insurer covers this, then call to schedule appointment to have this vaccine here.  Counseled on the influenza virus vaccine.  Vaccine information sheet given.   High dose Influenza vaccine given after consent obtained.   Kalani was seen today for medicare med check.  Diagnoses and all orders for this visit:  Chronic diarrhea -     Comprehensive metabolic panel -     CBC with Differential/Platelet -     Lipid panel -     TSH -     Vitamin B12 -     Magnesium -     Folate  Medicare annual wellness visit, subsequent  Need for influenza vaccination -     Flu vaccine HIGH DOSE PF (Fluzone High dose)  Shakiness -     Comprehensive metabolic panel -     CBC with Differential/Platelet -     TSH -     Vitamin B12 -     Magnesium -     Folate  Insomnia, unspecified type  Screening for breast cancer -     MM DIGITAL SCREENING BILATERAL; Future  Need for prophylactic vaccination and inoculation against influenza  Vulvodynia  Vitamin D deficiency -     Vitamin B12 -  Magnesium -     Folate  Vaccine counseling  History of breast cancer in female  Estrogen deficiency  Elevated cholesterol -     Lipid panel  Dysthymia -     Comprehensive metabolic panel -     CBC with Differential/Platelet -     TSH -     Vitamin B12 -     Magnesium -     Folate  Chronic neck pain  Osteoporosis, unspecified osteoporosis type, unspecified pathological fracture presence  DDD (degenerative disc disease), cervical  Lumbar back pain with radiculopathy affecting right lower extremity  Hearing loss, unspecified hearing loss type, unspecified laterality  Allergic rhinitis due to pollen, unspecified seasonality  Gastroesophageal reflux disease  without esophagitis  Other orders -     HYDROcodone-acetaminophen (NORCO) 5-325 MG tablet; Take 1 tablet by mouth every 6 (six) hours as needed for moderate pain. -     dexlansoprazole (DEXILANT) 60 MG capsule; Take 1 capsule (60 mg total) by mouth daily. -     Eluxadoline (VIBERZI) 75 MG TABS; Take 1 tablet by mouth daily.    Medicare Attestation A preventative services visit was completed today.  During the course of the visit the patient was educated and counseled about appropriate screening and preventive services.  A health risk assessment was established with the patient that included a review of current medications, allergies, social history, family history, medical and preventative health history, biometrics, and preventative screenings to identify potential safety concerns or impairments.  A personalized plan was printed today for the patient's records and use.   Personalized health advice and education was given today to reduce health risks and promote self management and wellness.  Information regarding end of life planning was discussed today.  Crisoforo Oxford, PA-C   06/20/2017

## 2017-06-21 ENCOUNTER — Other Ambulatory Visit: Payer: Self-pay | Admitting: Medical

## 2017-06-21 LAB — CBC WITH DIFFERENTIAL/PLATELET
BASOS PCT: 0.6 %
Basophils Absolute: 38 cells/uL (ref 0–200)
Eosinophils Absolute: 88 cells/uL (ref 15–500)
Eosinophils Relative: 1.4 %
HCT: 40.9 % (ref 35.0–45.0)
HEMOGLOBIN: 13.7 g/dL (ref 11.7–15.5)
LYMPHS ABS: 1493 {cells}/uL (ref 850–3900)
MCH: 30.8 pg (ref 27.0–33.0)
MCHC: 33.5 g/dL (ref 32.0–36.0)
MCV: 91.9 fL (ref 80.0–100.0)
MONOS PCT: 9.5 %
MPV: 10.1 fL (ref 7.5–12.5)
NEUTROS ABS: 4082 {cells}/uL (ref 1500–7800)
Neutrophils Relative %: 64.8 %
Platelets: 298 10*3/uL (ref 140–400)
RBC: 4.45 10*6/uL (ref 3.80–5.10)
RDW: 12.7 % (ref 11.0–15.0)
Total Lymphocyte: 23.7 %
WBC mixed population: 599 cells/uL (ref 200–950)
WBC: 6.3 10*3/uL (ref 3.8–10.8)

## 2017-06-21 LAB — COMPREHENSIVE METABOLIC PANEL
AG RATIO: 1.6 (calc) (ref 1.0–2.5)
ALT: 17 U/L (ref 6–29)
AST: 16 U/L (ref 10–35)
Albumin: 4.2 g/dL (ref 3.6–5.1)
Alkaline phosphatase (APISO): 57 U/L (ref 33–130)
BILIRUBIN TOTAL: 0.4 mg/dL (ref 0.2–1.2)
BUN: 12 mg/dL (ref 7–25)
CO2: 26 mmol/L (ref 20–32)
Calcium: 9.4 mg/dL (ref 8.6–10.4)
Chloride: 103 mmol/L (ref 98–110)
Creat: 0.82 mg/dL (ref 0.60–0.93)
GLUCOSE: 94 mg/dL (ref 65–99)
Globulin: 2.7 g/dL (calc) (ref 1.9–3.7)
Potassium: 5 mmol/L (ref 3.5–5.3)
Sodium: 137 mmol/L (ref 135–146)
Total Protein: 6.9 g/dL (ref 6.1–8.1)

## 2017-06-21 LAB — FOLATE: Folate: 22 ng/mL

## 2017-06-21 LAB — LIPID PANEL
CHOLESTEROL: 231 mg/dL — AB (ref <200)
HDL: 53 mg/dL (ref >50)
LDL Cholesterol (Calc): 142 mg/dL (calc) — ABNORMAL HIGH
NON-HDL CHOLESTEROL (CALC): 178 mg/dL — AB (ref <130)
TRIGLYCERIDES: 219 mg/dL — AB (ref <150)
Total CHOL/HDL Ratio: 4.4 (calc) (ref <5.0)

## 2017-06-21 LAB — TSH: TSH: 1.94 mIU/L (ref 0.40–4.50)

## 2017-06-21 LAB — VITAMIN B12: Vitamin B-12: 536 pg/mL (ref 200–1100)

## 2017-06-21 LAB — MAGNESIUM: Magnesium: 2.1 mg/dL (ref 1.5–2.5)

## 2017-06-21 MED ORDER — PRAVASTATIN SODIUM 20 MG PO TABS: 20.0000 mg | ORAL_TABLET | Freq: Every evening | ORAL | 0 refills | Status: DC

## 2017-06-21 MED ORDER — FLUOXETINE HCL 40 MG PO CAPS: 40.0000 mg | ORAL_CAPSULE | Freq: Every day | ORAL | 3 refills | Status: DC

## 2017-06-21 NOTE — Telephone Encounter (Signed)
Can pt have a refill on this 

## 2017-06-21 NOTE — Telephone Encounter (Signed)
Call out #[phone] with no refill

## 2017-06-24 ENCOUNTER — Encounter: Payer: Self-pay | Admitting: Medical

## 2017-06-24 ENCOUNTER — Other Ambulatory Visit: Payer: Self-pay | Admitting: Medical

## 2017-06-24 MED ORDER — OMEPRAZOLE 40 MG PO CPDR
40.0000 mg | DELAYED_RELEASE_CAPSULE | Freq: Every day | ORAL | 0 refills | Status: DC
Start: 2017-06-24 — End: 2017-08-27

## 2017-07-09 DIAGNOSIS — Z961 Presence of intraocular lens: Secondary | ICD-10-CM | POA: Diagnosis not present

## 2017-07-10 DIAGNOSIS — H903 Sensorineural hearing loss, bilateral: Secondary | ICD-10-CM | POA: Diagnosis not present

## 2017-08-27 ENCOUNTER — Other Ambulatory Visit: Payer: Self-pay | Admitting: Medical

## 2017-08-28 NOTE — Telephone Encounter (Signed)
Is this ok to refill?  

## 2017-08-29 NOTE — Telephone Encounter (Signed)
Ok to refill.   Call in [place], send the other.

## 2017-09-08 ENCOUNTER — Telehealth: Payer: Self-pay | Admitting: Medical

## 2017-09-08 NOTE — Telephone Encounter (Signed)
Initiated P.A. ZOLPIDEM

## 2017-09-22 NOTE — Telephone Encounter (Signed)
P.A. Approved per Cover my meds 

## 2017-09-23 NOTE — Telephone Encounter (Signed)
Called pharmacy & they already got paid claim and sent to pt

## 2017-09-24 ENCOUNTER — Other Ambulatory Visit: Payer: Self-pay | Admitting: Medical

## 2017-09-24 NOTE — Telephone Encounter (Signed)
Can pt have a refill on meds  

## 2017-09-24 NOTE — Telephone Encounter (Signed)
Call in please

## 2017-10-24 IMAGING — CR DG CERVICAL SPINE COMPLETE 4+V
8 series · 8 of 8 positions shown · non-contrast
Comparison: None.

CLINICAL DATA: Fell backwards off ladder with neck stiffness

EXAM:
CERVICAL SPINE - COMPLETE 4+ VIEW

[w cervical spine lat (1 of 2)]
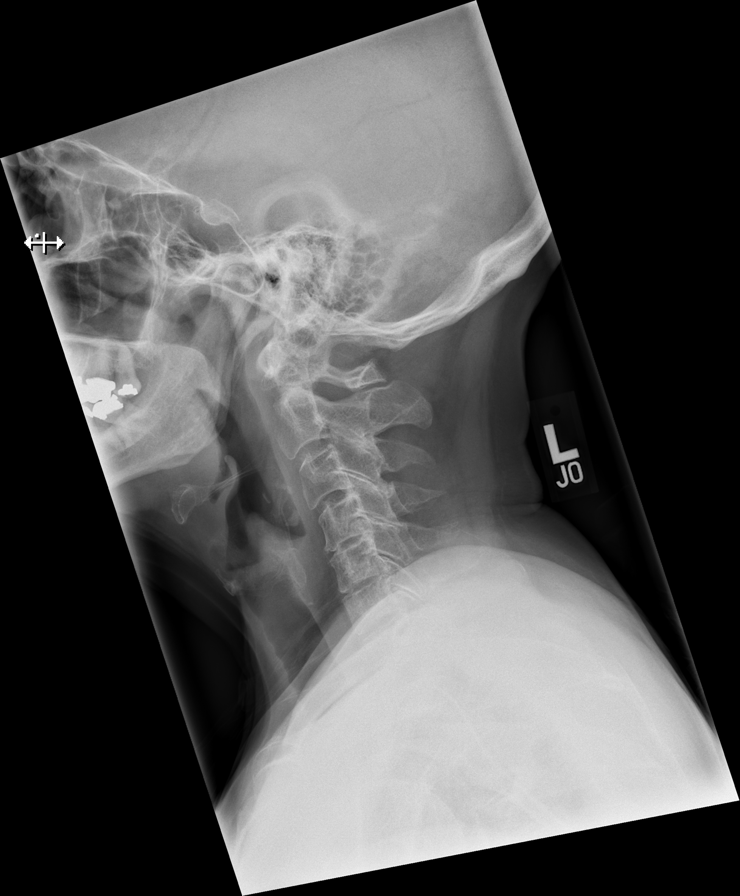

[w cervical spine lat (2 of 2)]
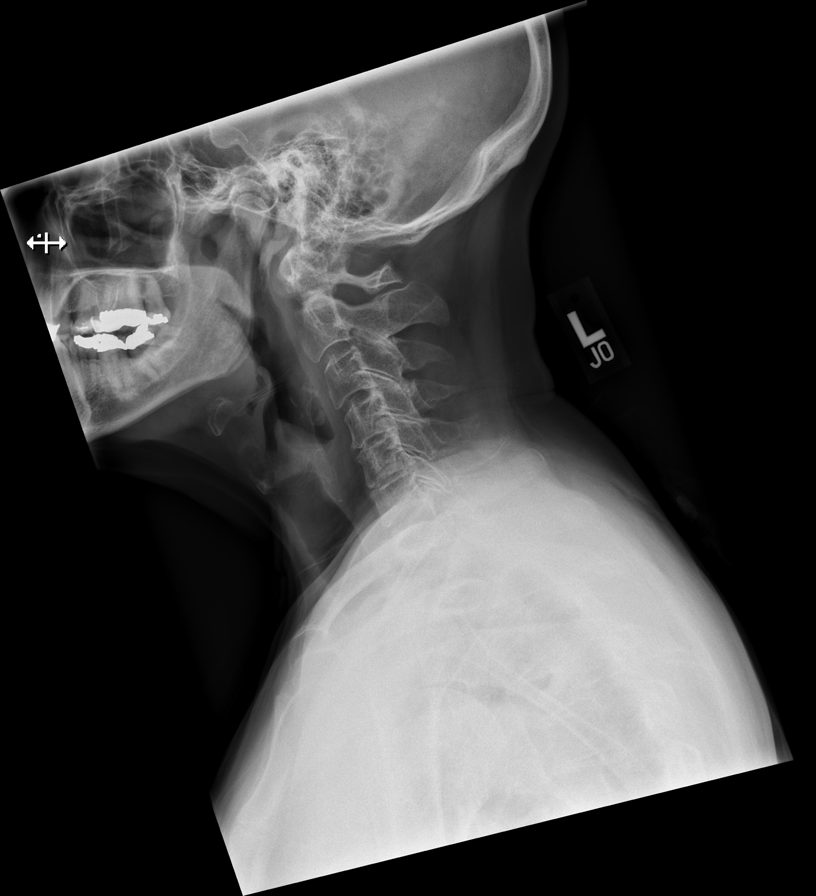

[w cervical spine ap_obl (1 of 2)]
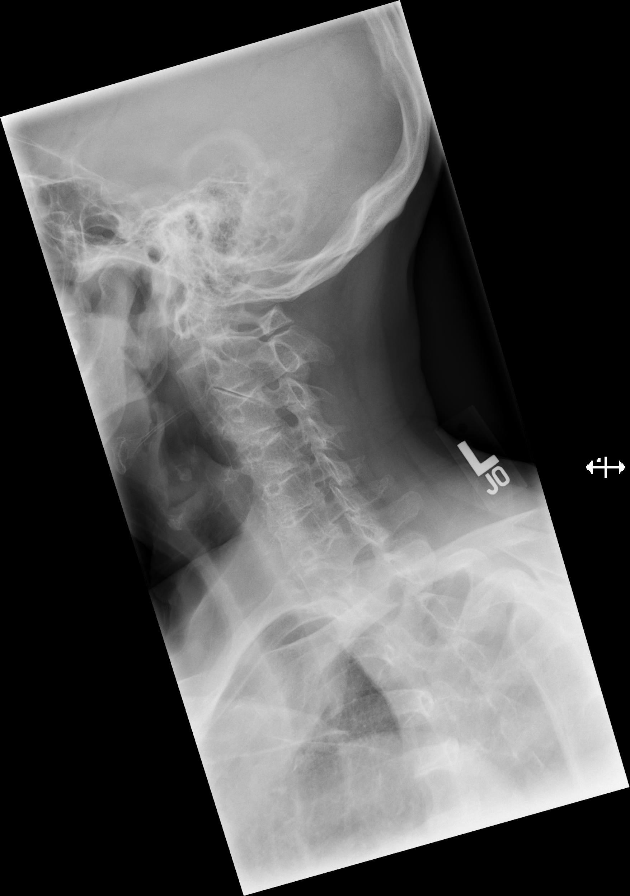

[w cervical spine ap_obl (2 of 2)]
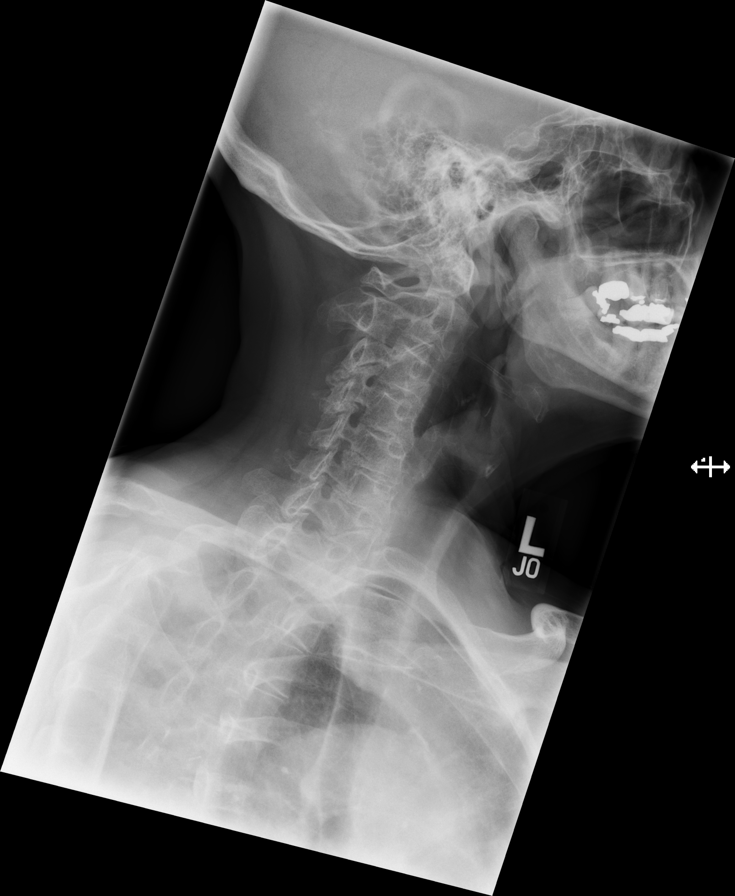

[w cervical spine ap]
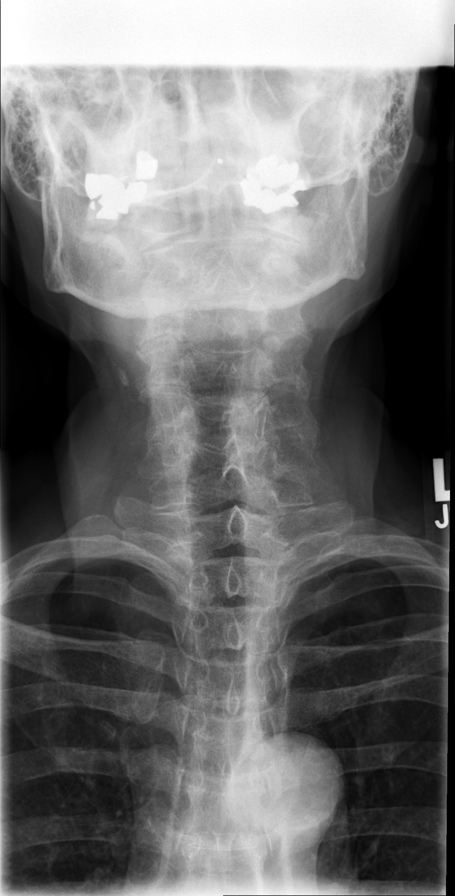

[w cervical spine odontoid (1 of 2)]
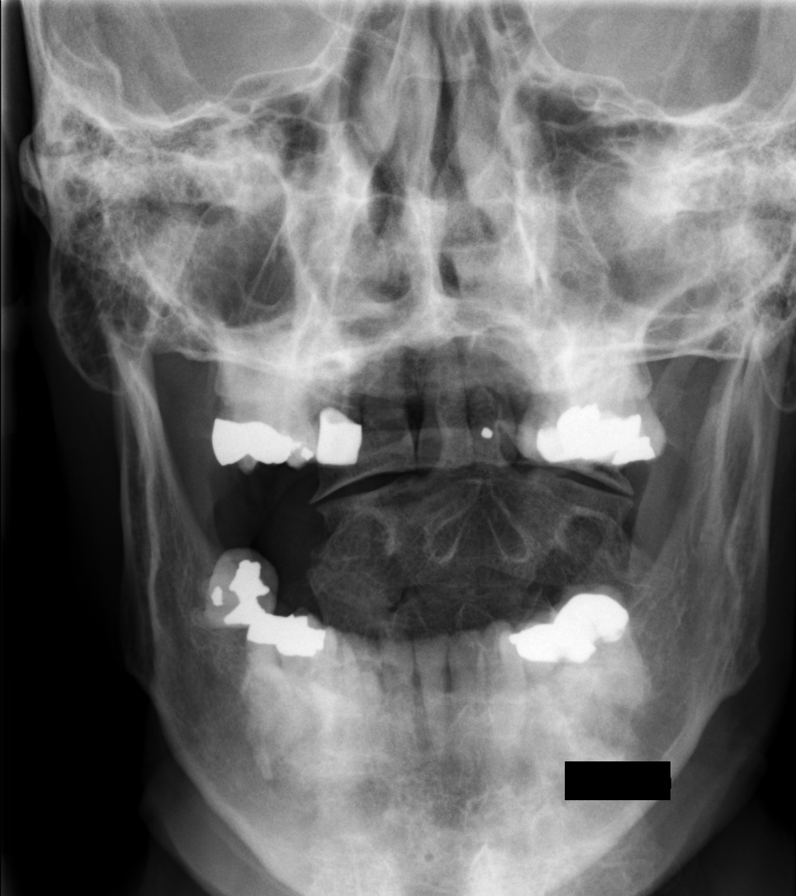

[w cervical spine odontoid (2 of 2)]
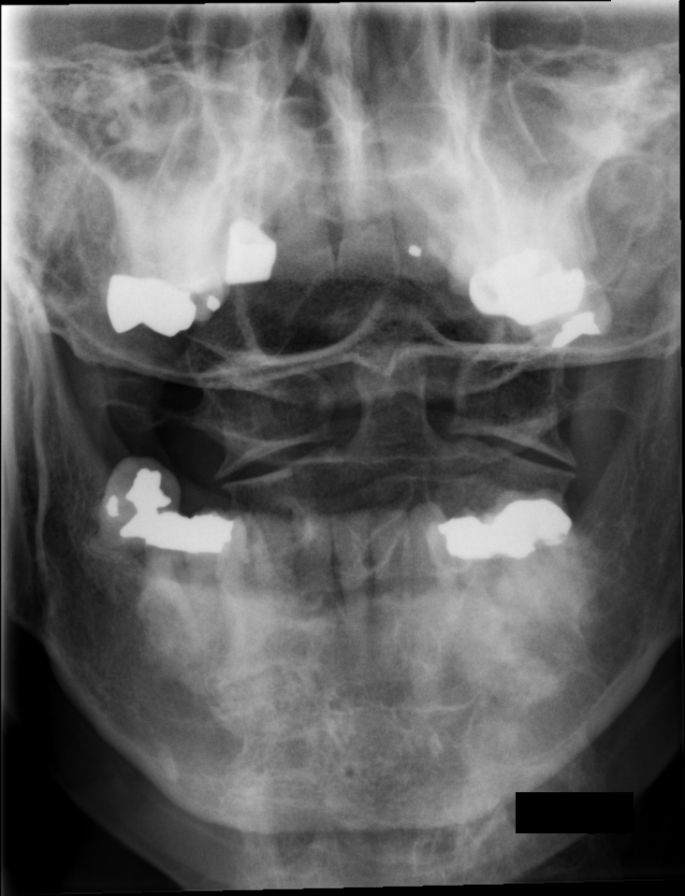

[w cervical swimmers]
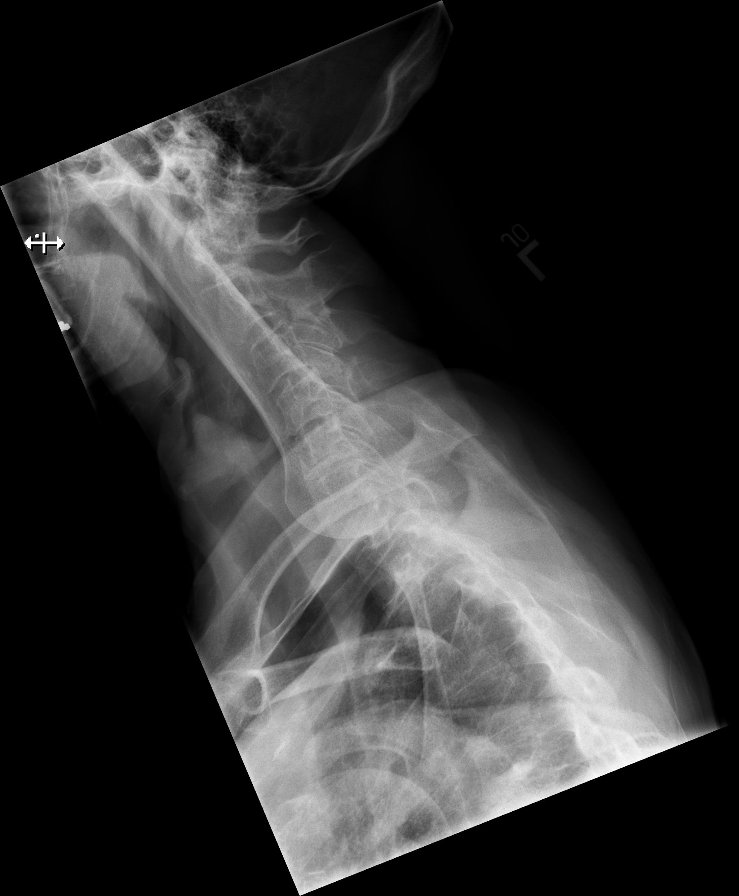

[8 of 8 positions shown; findings below may reference images not displayed]

FINDINGS: The cervical vertebrae are straightened in alignment. There is
degenerative disc disease at C4-5, C5-6, and C6-7 levels with loss
of disc space and mild spur formation. No acute fracture is seen. No
prevertebral soft tissue swelling is noted. There is foraminal
narrowing at C4-5, C5-6, and C6-7 levels bilaterally, particularly
at C5-6. The odontoid process is intact. The lung apices are clear.
IMPRESSION: 1. Straightened alignment with degenerative disc disease at C4-5,
C5-6, and C6-7.
2. Foraminal narrowing at the above-mentioned levels.

## 2017-10-24 IMAGING — CR DG SHOULDER 2+V*R*
3 series · 3 of 3 positions shown · non-contrast
Comparison: None.

CLINICAL DATA: Fell backwards off ladder with pain and stiffness in
the posterior right shoulder

EXAM:
RIGHT SHOULDER - 2+ VIEW

[w shoulder grashey right]
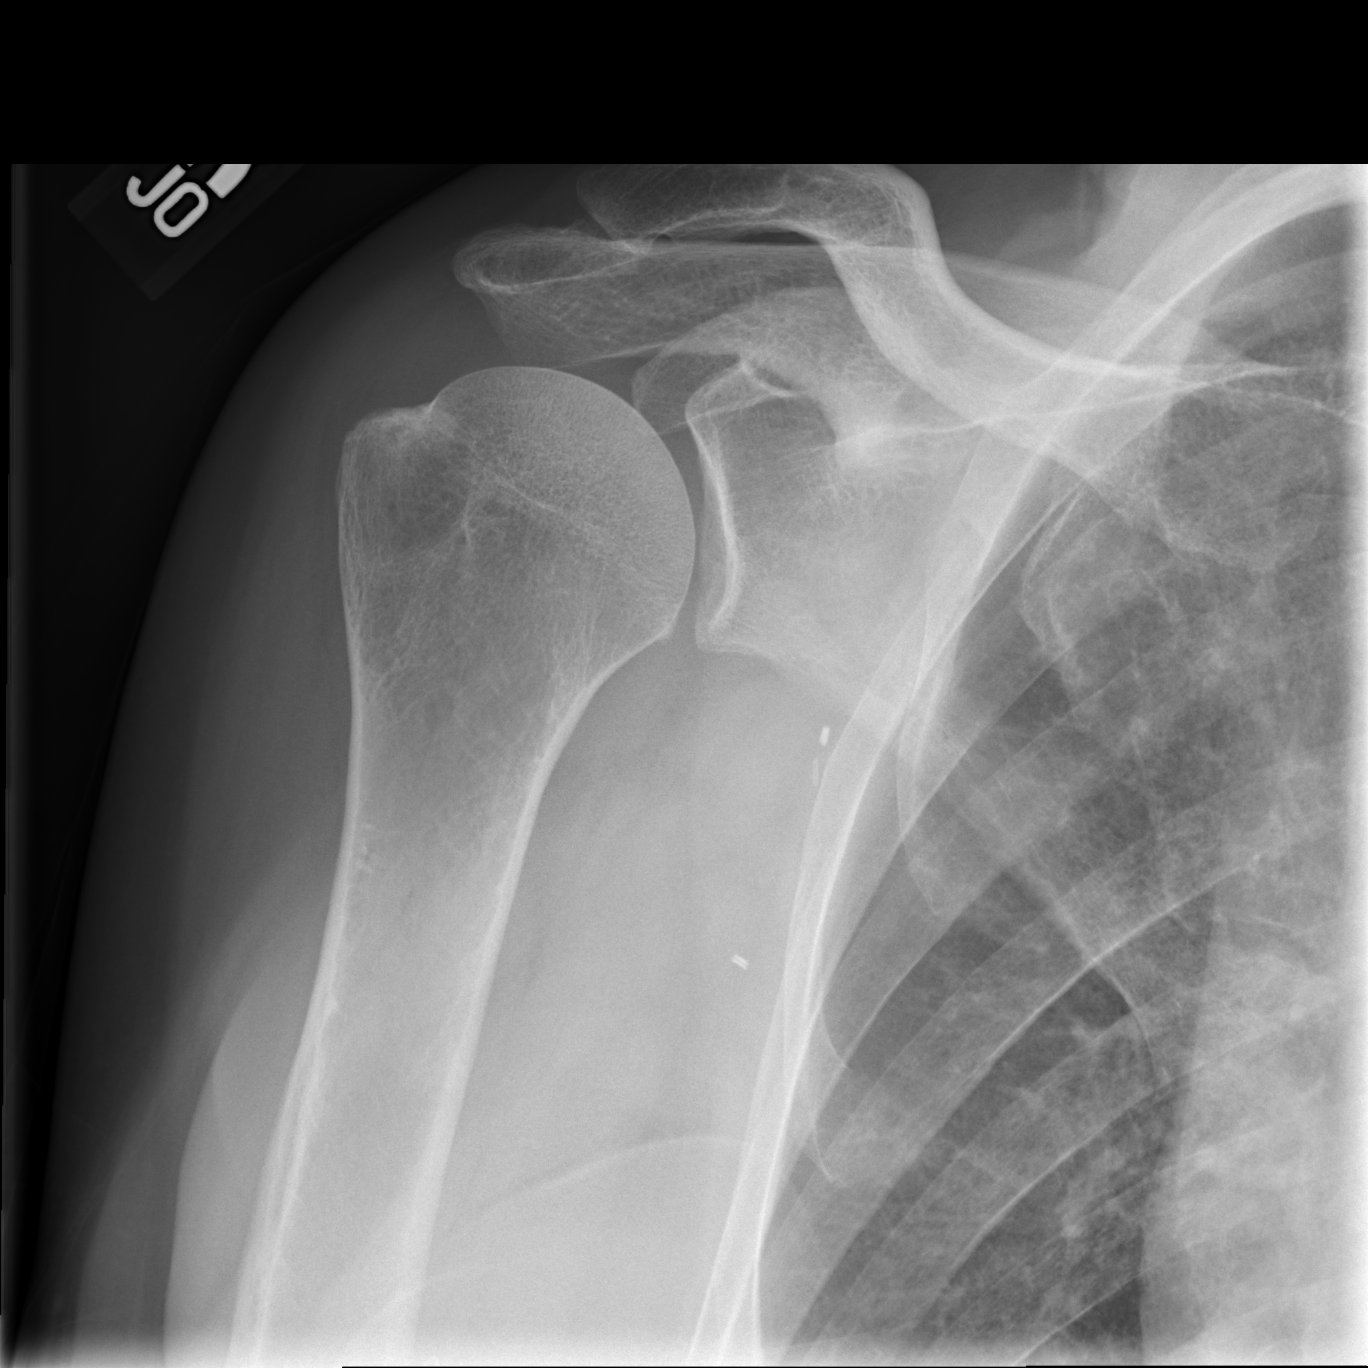

[w shoulder y-view right]
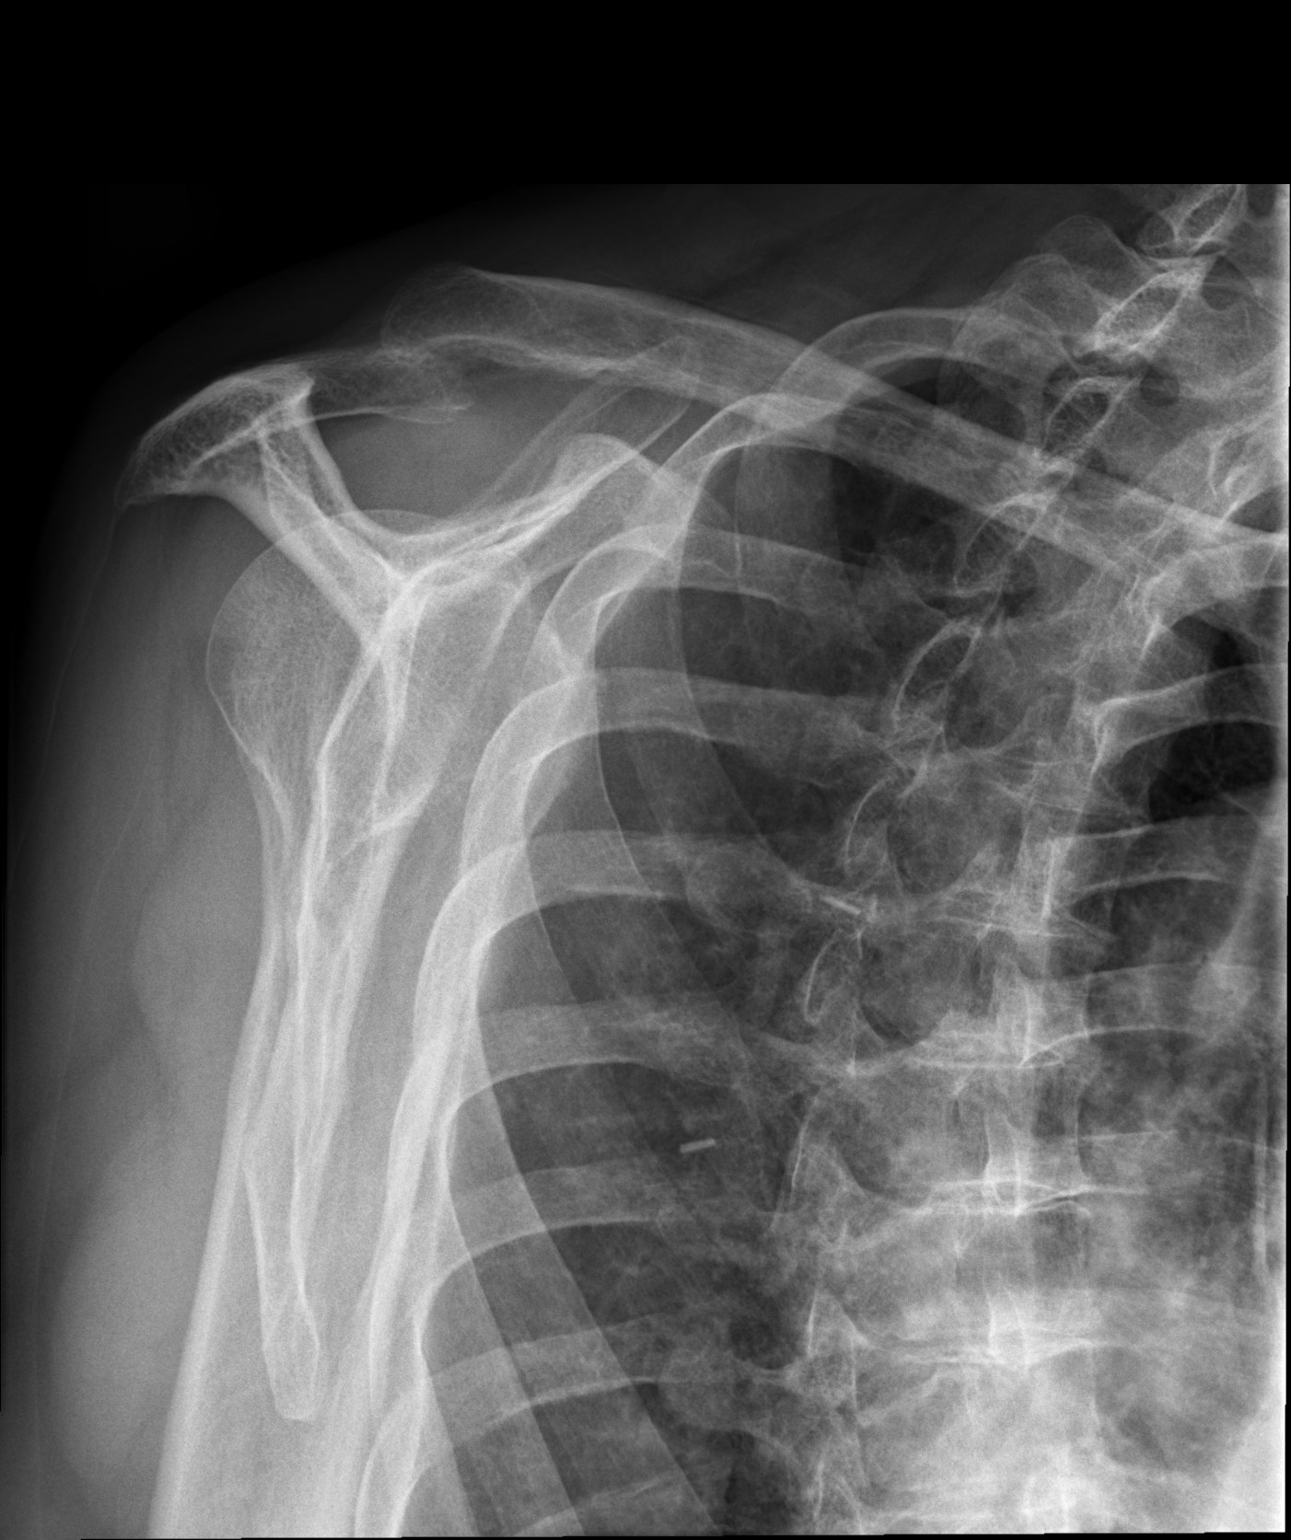

[w shoulder axillary right]
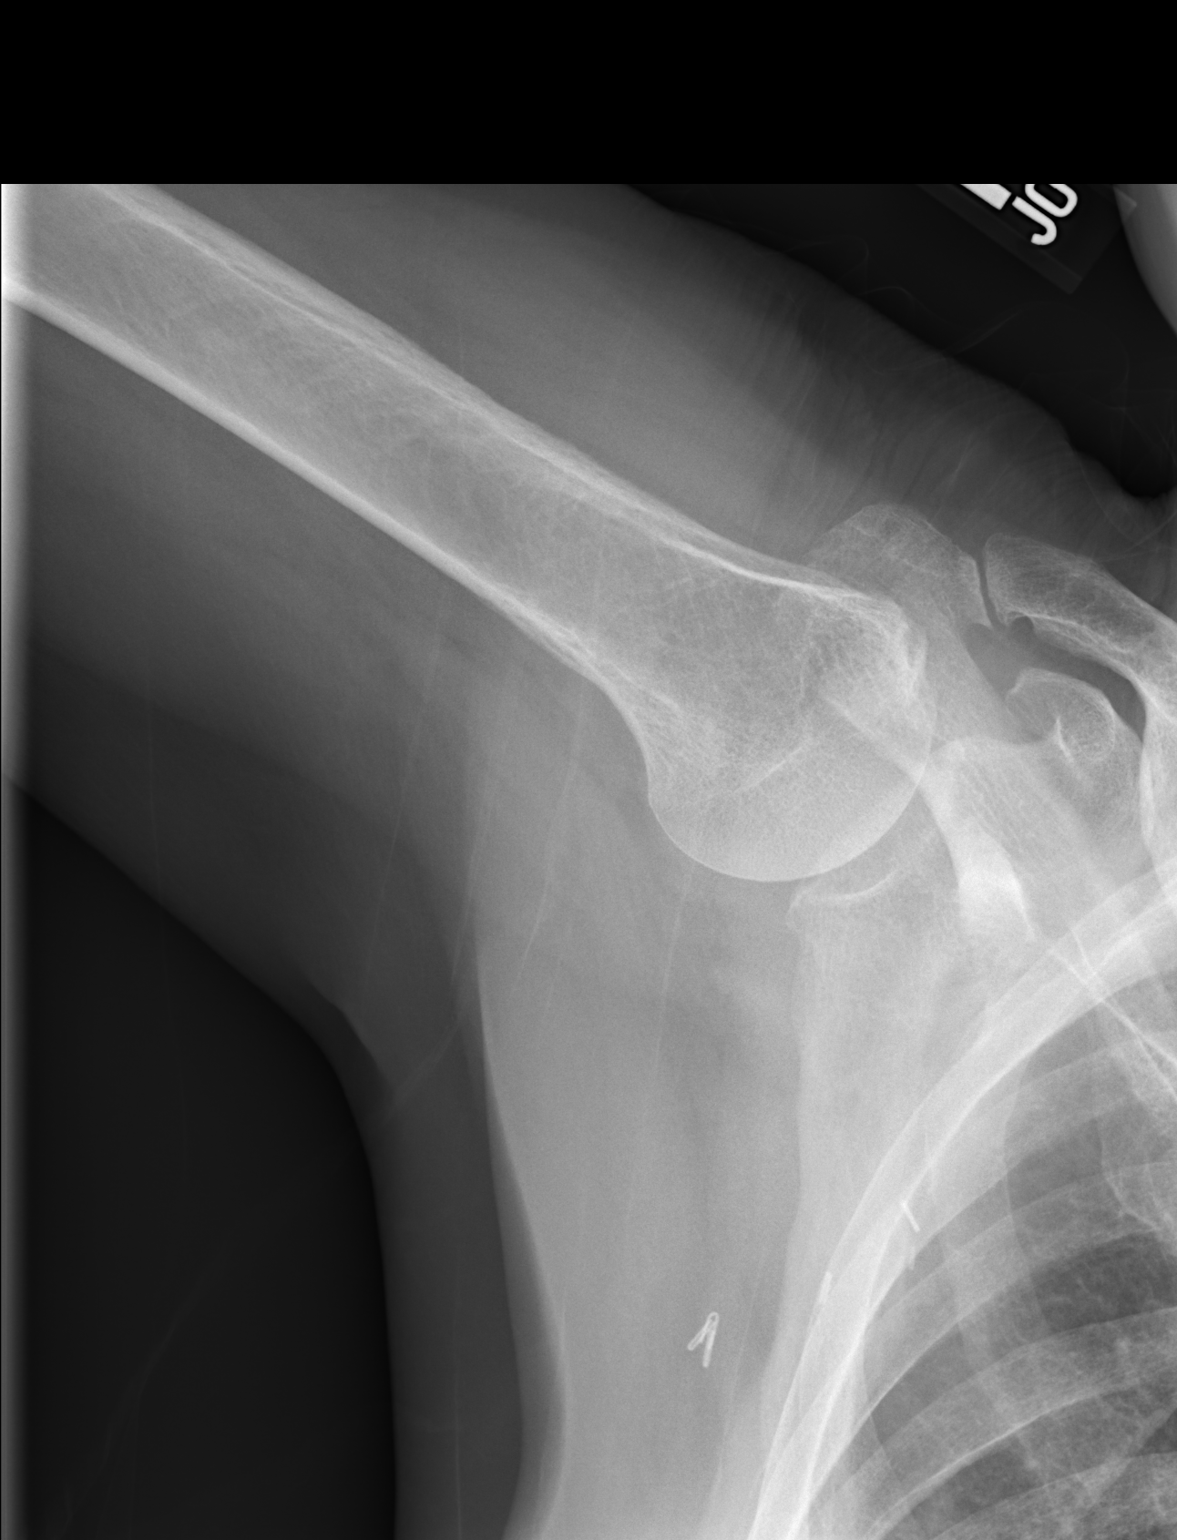

[3 of 3 positions shown; findings below may reference images not displayed]

FINDINGS: The right humeral head is in normal position and the glenohumeral
joint space is well preserved for age. The right AC joint appears
normally aligned. No acute abnormality is seen.
IMPRESSION: Negative.

## 2017-10-31 ENCOUNTER — Other Ambulatory Visit: Payer: Self-pay | Admitting: Medical

## 2017-11-01 NOTE — Telephone Encounter (Signed)
Pt had a cpe back in october

## 2017-11-01 NOTE — Telephone Encounter (Signed)
Please advise 

## 2017-12-03 ENCOUNTER — Other Ambulatory Visit: Payer: Self-pay | Admitting: Medical

## 2018-03-09 ENCOUNTER — Encounter: Payer: Self-pay | Admitting: Medical

## 2018-03-10 ENCOUNTER — Other Ambulatory Visit: Payer: Self-pay | Admitting: Medical

## 2018-03-10 MED ORDER — OMEPRAZOLE 40 MG PO CPDR: 40.0000 mg | DELAYED_RELEASE_CAPSULE | Freq: Every day | ORAL | 0 refills | Status: DC

## 2018-04-15 ENCOUNTER — Other Ambulatory Visit: Payer: Self-pay | Admitting: Medical

## 2018-04-15 NOTE — Telephone Encounter (Signed)
Is this ok to refill?  

## 2018-05-12 ENCOUNTER — Other Ambulatory Visit: Payer: Self-pay | Admitting: Medical

## 2018-06-11 ENCOUNTER — Other Ambulatory Visit: Payer: Self-pay | Admitting: Medical

## 2018-06-11 NOTE — Telephone Encounter (Signed)
Pt has an appt on October 28th. Is this okay to refill

## 2018-06-16 ENCOUNTER — Other Ambulatory Visit: Payer: Self-pay | Admitting: Medical

## 2018-06-17 ENCOUNTER — Telehealth: Payer: Self-pay | Admitting: Medical

## 2018-06-17 NOTE — Telephone Encounter (Signed)
Make sure she has a med check scheduled

## 2018-06-17 NOTE — Telephone Encounter (Signed)
Pt is coming in for a medicare well visit 06/23/2018

## 2018-06-23 ENCOUNTER — Ambulatory Visit (INDEPENDENT_AMBULATORY_CARE_PROVIDER_SITE_OTHER): Payer: Medicare HMO | Admitting: Medical

## 2018-06-23 ENCOUNTER — Encounter: Payer: Self-pay | Admitting: Medical

## 2018-06-23 VITALS — BP 124/80 | HR 66 | Temp 98.3°F | Resp 16 | Ht 67.0 in | Wt 153.2 lb

## 2018-06-23 DIAGNOSIS — F341 Dysthymic disorder: Secondary | ICD-10-CM

## 2018-06-23 DIAGNOSIS — Z0000 Encounter for general adult medical examination without abnormal findings: Secondary | ICD-10-CM | POA: Diagnosis not present

## 2018-06-23 DIAGNOSIS — J301 Allergic rhinitis due to pollen: Secondary | ICD-10-CM | POA: Diagnosis not present

## 2018-06-23 DIAGNOSIS — N94819 Vulvodynia, unspecified: Secondary | ICD-10-CM

## 2018-06-23 DIAGNOSIS — Z7185 Encounter for immunization safety counseling: Secondary | ICD-10-CM

## 2018-06-23 DIAGNOSIS — Z23 Encounter for immunization: Secondary | ICD-10-CM

## 2018-06-23 DIAGNOSIS — K529 Noninfective gastroenteritis and colitis, unspecified: Secondary | ICD-10-CM

## 2018-06-23 DIAGNOSIS — Z7189 Other specified counseling: Secondary | ICD-10-CM

## 2018-06-23 DIAGNOSIS — G8929 Other chronic pain: Secondary | ICD-10-CM

## 2018-06-23 DIAGNOSIS — E559 Vitamin D deficiency, unspecified: Secondary | ICD-10-CM

## 2018-06-23 DIAGNOSIS — E7800 Pure hypercholesterolemia, unspecified: Secondary | ICD-10-CM

## 2018-06-23 DIAGNOSIS — M810 Age-related osteoporosis without current pathological fracture: Secondary | ICD-10-CM | POA: Diagnosis not present

## 2018-06-23 DIAGNOSIS — M5030 Other cervical disc degeneration, unspecified cervical region: Secondary | ICD-10-CM

## 2018-06-23 DIAGNOSIS — M5416 Radiculopathy, lumbar region: Secondary | ICD-10-CM | POA: Diagnosis not present

## 2018-06-23 DIAGNOSIS — H9190 Unspecified hearing loss, unspecified ear: Secondary | ICD-10-CM | POA: Diagnosis not present

## 2018-06-23 DIAGNOSIS — K219 Gastro-esophageal reflux disease without esophagitis: Secondary | ICD-10-CM | POA: Diagnosis not present

## 2018-06-23 DIAGNOSIS — M542 Cervicalgia: Secondary | ICD-10-CM

## 2018-06-23 DIAGNOSIS — K148 Other diseases of tongue: Secondary | ICD-10-CM

## 2018-06-23 DIAGNOSIS — G4700 Insomnia, unspecified: Secondary | ICD-10-CM

## 2018-06-23 DIAGNOSIS — Z853 Personal history of malignant neoplasm of breast: Secondary | ICD-10-CM

## 2018-06-23 DIAGNOSIS — E2839 Other primary ovarian failure: Secondary | ICD-10-CM

## 2018-06-23 DIAGNOSIS — Z1239 Encounter for other screening for malignant neoplasm of breast: Secondary | ICD-10-CM

## 2018-06-23 LAB — POCT URINALYSIS DIP (PROADVANTAGE DEVICE)
Bilirubin, UA: NEGATIVE
Glucose, UA: NEGATIVE mg/dL
Ketones, POC UA: NEGATIVE mg/dL
LEUKOCYTES UA: NEGATIVE
NITRITE UA: NEGATIVE
PH UA: 7 (ref 5.0–8.0)
PROTEIN UA: NEGATIVE mg/dL
RBC UA: NEGATIVE
Specific Gravity, Urine: 1.015
UUROB: NEGATIVE

## 2018-06-23 MED ORDER — IBANDRONATE SODIUM 150 MG PO TABS: ORAL_TABLET | ORAL | 3 refills | Status: DC

## 2018-06-23 MED ORDER — SUVOREXANT 10 MG PO TABS: 1.0000 | ORAL_TABLET | Freq: Every day | ORAL | 0 refills | Status: DC

## 2018-06-23 NOTE — Progress Notes (Signed)
Subjective:    Emily Cowan is a 72 y.o. female who presents for Preventative Services visit and chronic medical problems/med check visit.    Primary Care Provider Tysinger, Camelia Eng, PA-C here for primary care  Current Health Care Team:  Dentist, Dr. none  Eye doctor, Dr. Jefm Bryant    Medical Services you may have received from other than [facility] providers in the past year (date may be approximate) none  Went to [facility] in September.  Did helicopter tour.      A friend of her's is an audiologist and she had recent normal hearing exam.  Needs mammogram order.   Wants her flu shot today.  Still has sleep problems.  Ambien just does not seem to help.  Still wakes up 3 or 4 AM.  She still has the growth on tongue that bothers her but has not seen oral surgery yet.   Exercise Current exercise habits: works outside   Nutrition/Diet Current diet: none   Depression Screen Depression screen PHQ 2/9 06/23/2018  Decreased Interest 0  Down, Depressed, Hopeless 0  PHQ - 2 Score 0  Altered sleeping -  Tired, decreased energy -  Change in appetite -  Feeling bad or failure about yourself  -  Trouble concentrating -  Moving slowly or fidgety/restless -  Suicidal thoughts -  PHQ-9 Score -    Activities of Daily Living Screen/Functional Status Survey Is the patient deaf or have difficulty hearing?: No Does the patient have difficulty seeing, even when wearing glasses/contacts?: No Does the patient have difficulty concentrating, remembering, or making decisions?: No Does the patient have difficulty walking or climbing stairs?: No Does the patient have difficulty dressing or bathing?: No Does the patient have difficulty doing errands alone such as visiting a doctor's office or shopping?: No   Fall Risk Screen Fall Risk  06/23/2018 06/20/2017 06/19/2016 06/01/2015 04/07/2014  Falls in the past year? No No Yes No No  Number falls in past yr: - - 1 - -  Injury with Fall? -  - Yes - -  Risk for fall due to : - - Impaired balance/gait - -    Gait Assessment: Normal gait observed {YES Advanced directives yes Does patient have a Ruth? Yes Does patient have a Living Will? yes Is Past Medical History:  Diagnosis Date  . Alcohol abuse   . Allergic rhinitis   . Cancer ([place]) 2002   BREAST  . Depression   . Family history of cancer    multiple  . Hearing impaired    wears hearing aids  . Hemangioma of liver 2005   MRI   . Osteoporosis   . Seborrheic dermatitis   . Vulvodynia    longstanding, failed Premarin cream; can't tolerate pelvic exams  . Wears glasses     Past Surgical History:  Procedure Laterality Date  . COLONOSCOPY  2011   in [facility]  . COLONOSCOPY  2018  . MASTECTOMY  2002   right  . TONSILLECTOMY    . WISDOM TOOTH EXTRACTION      Social History   Socioeconomic History  . Marital status: Single    Spouse name: Not on file  . Number of children: Not on file  . Years of education: Not on file  . Highest education level: Not on file  Occupational History  . Not on file  Social Needs  . Financial resource strain: Not on file  . Food insecurity:  Worry: Not on file    Inability: Not on file  . Transportation needs:    Medical: Not on file    Non-medical: Not on file  Tobacco Use  . Smoking status: Former Smoker    Last attempt to quit: 1992    Years since quitting: 27.8  . Smokeless tobacco: Never Used  Substance and Sexual Activity  . Alcohol use: No    Alcohol/week: 0.0 standard drinks  . Drug use: No  . Sexual activity: Not Currently  Lifestyle  . Physical activity:    Days per week: Not on file    Minutes per session: Not on file  . Stress: Not on file  Relationships  . Social connections:    Talks on phone: Not on file    Gets together: Not on file    Attends religious service: Not on file    Active member of club or organization: Not on file    Attends meetings of clubs or  organizations: Not on file    Relationship status: Not on file  . Intimate partner violence:    Fear of current or ex partner: Not on file    Emotionally abused: Not on file    Physically abused: Not on file    Forced sexual activity: Not on file  Other Topics Concern  . Not on file  Social History Narrative   Single, no children, retired, lesbian but no current relationship; exercise- working in the yard, cutting down trees, Biomedical scientist, walking the dogs.  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 Cancer Mother        GI cancer  . Cancer Father        liver  . Stroke Father   . Cancer Sister        vulvar  . Cancer Brother        liver  . Cancer Brother        lung  . Diabetes Neg Hx   . Heart disease Neg Hx   . Hypertension Neg Hx   . Hyperlipidemia Neg Hx   . Breast cancer Neg Hx      Current Outpatient Medications:  .  calcium carbonate (OS-CAL) 600 MG TABS tablet, Take 600 mg by mouth daily with breakfast., Disp: , Rfl:  .  Cholecalciferol (VITAMIN D) 2000 units CAPS, Take 2,000 capsules by mouth 1 day or 1 dose., Disp: , Rfl:  .  FLUoxetine (PROZAC) 40 MG capsule, TAKE 1 CAPSULE EVERY DAY, Disp: 90 capsule, Rfl: 0 .  Glucosamine-Chondroit-Vit C-Mn (GLUCOSAMINE CHONDR 1500 COMPLX) CAPS, Take 1 capsule by mouth daily.  , Disp: , Rfl:  .  ibandronate (BONIVA) 150 MG tablet, TAKE 1 TABLET  EVERY  30  DAYS, Disp: 4 tablet, Rfl: 2 .  ibuprofen (ADVIL,MOTRIN) 800 MG tablet, Take 1 tablet (800 mg total) by mouth every 8 (eight) hours as needed., Disp: 30 tablet, Rfl: 0 .  Multiple Vitamins-Minerals (MULTIVITAMIN WITH MINERALS) tablet, Take 1 tablet by mouth daily.  , Disp: , Rfl:  .  omeprazole (PRILOSEC) 40 MG capsule, TAKE 1 CAPSULE EVERY DAY, Disp: 90 capsule, Rfl: 0 .  zolpidem (AMBIEN) 5 MG tablet, TAKE 1 TABLET AT BEDTIME AS NEEDED FOR SLEEP, Disp: 90 tablet, Rfl: 0 .  HYDROcodone-acetaminophen (NORCO) 5-325 MG tablet, Take 1 tablet  by mouth every 6 (six) hours as needed for moderate pain. (Patient not taking: Reported on 06/23/2018), Disp: 30 tablet, Rfl:  0  Current Facility-Administered Medications:  .  0.9 %  sodium chloride infusion, 500 mL, Intravenous, Continuous, Pyrtle, Lajuan Lines, MD  No Known Allergies  History reviewed: allergies, current medications, past family history, past medical history, past social history, past surgical history and problem list  Chronic issues discussed: Omeprazole is working well, so not having to use Viberzi for chronic diarrhea.  Seems to be doing okay with gastric issues.  Compliant with Boniva monthly for osteoporosis, as well as vitamin D daily  Chronic neck pain and chronic back pain, uses ibuprofen periodically, hydrocodone for breakthrough pain periodically.  Compliant with Prozac, no concerns currently   Objective:      BiometricsBP 124/80   Pulse 66   Temp 98.3 F (36.8 C) (Oral)   Resp 16   Ht 5\' 7"  (1.702 m)   Wt 153 lb 3.2 oz (69.5 kg)   SpO2 97%   BMI 23.99 kg/m   Wt Readings from Last 3 Encounters:  06/23/18 153 lb 3.2 oz (69.5 kg)  06/20/17 156 lb 12.8 oz (71.1 kg)  09/24/16 159 lb (72.1 kg)   BP Readings from Last 3 Encounters:  06/23/18 124/80  06/20/17 114/68  09/24/16 122/72    Cognitive Testing  Alert? Yes  Normal Appearance?Yes  Oriented to person? Yes  Place? Yes   Time? Yes  Recall of three objects?  yes  Can perform simple calculations? yes  Displays appropriate judgment?yes  Can read the correct time from a watch face?yes   General appearance:  alert, no distress, WD/WN, white female HEENT: normocephalic, sclerae anicteric, TMs pearly, nares patent, no discharge or erythema, pharynx normal Oral cavity: MMM, no lesions Neck: supple, no lymphadenopathy, no thyromegaly, no masses Heart: RRR, normal S1, S2, no murmurs Lungs: CTA bilaterally, no wheezes, rhonchi, or rales Abdomen: +bs, soft, non tender, non distended, no masses, no  hepatomegaly, no splenomegaly Musculoskeletal: neck decreased ROM mildly, pain with ROM, back also with pain with ROM which is mild to moderately limited due to pain, non tender, no swelling, no obvious deformity Extremities: no edema, no cyanosis, no clubbing Pulses: 2+ symmetric, upper and lower extremities, normal cap refill Neurological: alert, oriented x 3, CN2-12 intact, strength normal upper extremities and lower extremities, sensation normal throughout, DTRs 2+ throughout, no cerebellar signs, gait normal Psychiatric: normal affect, behavior normal, pleasant  Breast/gyn/rectal - declined   Assessment:   Encounter Diagnoses  Name Primary?  . Encounter for health maintenance examination in adult Yes  . Osteoporosis, unspecified osteoporosis type, unspecified pathological fracture presence   . Medicare annual wellness visit, subsequent   . Allergic rhinitis due to pollen, unspecified seasonality   . Chronic diarrhea   . Gastroesophageal reflux disease without esophagitis   . Hearing loss, unspecified hearing loss type, unspecified laterality   . Lumbar back pain with radiculopathy affecting right lower extremity   . DDD (degenerative disc disease), cervical   . Dysthymia   . Vulvodynia   . Chronic neck pain   . Elevated cholesterol   . Estrogen deficiency   . History of breast cancer in female   . Insomnia, unspecified type   . Need for influenza vaccination   . Vitamin D deficiency   . Vaccine counseling   . Screening for breast cancer   . Tongue lesion      Plan:   A preventative services visit was completed today.  During the course of the visit today, we discussed and counseled about appropriate screening and preventive services.  A health risk assessment was established today that included a review of current medications, allergies, social history, family history, medical and preventative health history, biometrics, and preventative screenings to identify potential  safety concerns or impairments.  A personalized plan was printed today for your records and use.   Personalized health advice and education was given today to reduce health risks and promote self management and wellness.  Information regarding end of life planning was discussed today.  Conditions/risks identified: Insomnia still given her problems- stop Ambien, begin trial of Belsomra, discussed sleep hygiene  Tongue lesion-advised oral surgery consult  Chronic problems discussed today: GERD-continue current medication, discussed risk and benefits of medication, avoid triggers  Dysthymia-continue current medication  Osteoporosis-advised weightbearing and aerobic exercise, continue Boniva and vitamin D supplementation, plan to repeat bone density scan next year  History of breast cancer- continue monthly self breast exams, mammograms yearly  Chronic neck and back pain-manages with medication as needed, exercise, but advised if her symptoms get worse we will need to refer to orthopedist  Acute problems discussed today: none  Recommendations:  I recommend a yearly ophthalmology/optometry visit for glaucoma screening and eye checkup  I recommended a yearly dental visit for hygiene and checkup  Advanced directives -she has completed her advanced directives but has not given [place] a copy yet.  Encouraged her to do so.   Referrals today: Mammogram  Immunizations: Counseled on the influenza virus vaccine.  Vaccine information sheet given.   High dose Influenza vaccine given after consent obtained.  Counseled on shingles vaccine  She is up-to-date on pneumococcal and tetanus vaccines  Kaeden was seen today for mvw.  Diagnoses and all orders for this visit:  Encounter for health maintenance examination in adult -     VITAMIN D 25 Hydroxy (Vit-D Deficiency, Fractures) -     Comprehensive metabolic panel -     CBC with Differential/Platelet -     Lipid panel -     POCT Urinalysis  DIP (Proadvantage Device)  Osteoporosis, unspecified osteoporosis type, unspecified pathological fracture presence -     VITAMIN D 25 Hydroxy (Vit-D Deficiency, Fractures) -     Comprehensive metabolic panel -     CBC with Differential/Platelet  Medicare annual wellness visit, subsequent  Allergic rhinitis due to pollen, unspecified seasonality  Chronic diarrhea  Gastroesophageal reflux disease without esophagitis  Hearing loss, unspecified hearing loss type, unspecified laterality  Lumbar back pain with radiculopathy affecting right lower extremity  DDD (degenerative disc disease), cervical  Dysthymia  Vulvodynia  Chronic neck pain  Elevated cholesterol -     Lipid panel  Estrogen deficiency  History of breast cancer in female -     MM Digital Screening Unilat L; Future  Insomnia, unspecified type  Need for influenza vaccination  Vitamin D deficiency -     VITAMIN D 25 Hydroxy (Vit-D Deficiency, Fractures)  Vaccine counseling  Screening for breast cancer -     MM Digital Screening Unilat L; Future  Tongue lesion    Medicare Attestation A preventative services visit was completed today.  During the course of the visit the patient was educated and counseled about appropriate screening and preventive services.  A health risk assessment was established with the patient that included a review of current medications, allergies, social history, family history, medical and preventative health history, biometrics, and preventative screenings to identify potential safety concerns or impairments.  A personalized plan was printed today for the patient's records and use.   Personalized  health advice and education was given today to reduce health risks and promote self management and wellness.  Information regarding end of life planning was discussed today.  Dorothea Ogle, PA-C   06/23/2018

## 2018-06-23 NOTE — Patient Instructions (Signed)
Recommendations  Tongue lesion- I recommend you see oral surgery for consult on this  Insomnia-begin trial of Belsomra.  We will see if we get insurance to pay for this.  This medication would replace Ambien  Call and schedule your mammogram  We will call with lab results  Get [place] a copy of your advanced directives, living will healthcare power of attorney  Try to get both aerobic and weightbearing exercise regularly  Osteoporosis-continue vitamin D daily, Boniva monthly, plan to repeat bone density scan next year

## 2018-06-24 ENCOUNTER — Other Ambulatory Visit: Payer: Self-pay | Admitting: Medical

## 2018-06-24 LAB — CBC WITH DIFFERENTIAL/PLATELET
BASOS: 1 %
Basophils Absolute: 0 10*3/uL (ref 0.0–0.2)
EOS (ABSOLUTE): 0.1 10*3/uL (ref 0.0–0.4)
Eos: 1 %
HEMATOCRIT: 38.2 % (ref 34.0–46.6)
Hemoglobin: 13 g/dL (ref 11.1–15.9)
IMMATURE GRANULOCYTES: 0 %
Immature Grans (Abs): 0 10*3/uL (ref 0.0–0.1)
LYMPHS ABS: 1.5 10*3/uL (ref 0.7–3.1)
Lymphs: 28 %
MCH: 31.6 pg (ref 26.6–33.0)
MCHC: 34 g/dL (ref 31.5–35.7)
MCV: 93 fL (ref 79–97)
MONOS ABS: 0.6 10*3/uL (ref 0.1–0.9)
Monocytes: 11 %
Neutrophils Absolute: 3.1 10*3/uL (ref 1.4–7.0)
Neutrophils: 59 %
PLATELETS: 311 10*3/uL (ref 150–450)
RBC: 4.12 x10E6/uL (ref 3.77–5.28)
RDW: 12.6 % (ref 12.3–15.4)
WBC: 5.2 10*3/uL (ref 3.4–10.8)

## 2018-06-24 LAB — COMPREHENSIVE METABOLIC PANEL
A/G RATIO: 1.7 (ref 1.2–2.2)
ALBUMIN: 4.3 g/dL (ref 3.5–4.8)
ALK PHOS: 60 IU/L (ref 39–117)
ALT: 16 IU/L (ref 0–32)
AST: 16 IU/L (ref 0–40)
BUN / CREAT RATIO: 19 (ref 12–28)
BUN: 13 mg/dL (ref 8–27)
Bilirubin Total: 0.3 mg/dL (ref 0.0–1.2)
CALCIUM: 9 mg/dL (ref 8.7–10.3)
CO2: 20 mmol/L (ref 20–29)
Chloride: 105 mmol/L (ref 96–106)
Creatinine, Ser: 0.67 mg/dL (ref 0.57–1.00)
GFR calc Af Amer: 102 mL/min/{1.73_m2} (ref >59)
GFR, EST NON AFRICAN AMERICAN: 88 mL/min/{1.73_m2} (ref >59)
GLOBULIN, TOTAL: 2.5 g/dL (ref 1.5–4.5)
GLUCOSE: 89 mg/dL (ref 65–99)
POTASSIUM: 4.4 mmol/L (ref 3.5–5.2)
SODIUM: 140 mmol/L (ref 134–144)
Total Protein: 6.8 g/dL (ref 6.0–8.5)

## 2018-06-24 LAB — VITAMIN D 25 HYDROXY (VIT D DEFICIENCY, FRACTURES): Vit D, 25-Hydroxy: 25.5 ng/mL — ABNORMAL LOW (ref 30.0–100.0)

## 2018-06-24 LAB — LIPID PANEL
CHOL/HDL RATIO: 4.1 ratio (ref 0.0–4.4)
Cholesterol, Total: 239 mg/dL — ABNORMAL HIGH (ref 100–199)
HDL: 58 mg/dL (ref >39)
LDL Calculated: 153 mg/dL — ABNORMAL HIGH (ref 0–99)
Triglycerides: 141 mg/dL (ref 0–149)
VLDL Cholesterol Cal: 28 mg/dL (ref 5–40)

## 2018-06-24 MED ORDER — ROSUVASTATIN CALCIUM 10 MG PO TABS: 10.0000 mg | ORAL_TABLET | Freq: Every day | ORAL | 3 refills | Status: DC

## 2018-07-23 ENCOUNTER — Other Ambulatory Visit: Payer: Self-pay | Admitting: Medical

## 2018-07-28 NOTE — Telephone Encounter (Signed)
Is this ok to refill?  

## 2018-07-29 ENCOUNTER — Other Ambulatory Visit: Payer: Self-pay | Admitting: Medical

## 2018-07-30 ENCOUNTER — Telehealth: Payer: Self-pay | Admitting: Medical

## 2018-07-30 NOTE — Telephone Encounter (Signed)
I spoke with pt regarding Belsomra, issues are discount/coupon cards will not help in her case because she has [employer] and they do not help with any Medicare plans.  Prior Authorization also will not help because insurance is covering it but it is a higher Tier medication and the co pay is $125 & she states she can not afford it.  She states she was getting the Zolpidem for $0 co pay.  She would like to try samples if we have any if not she would like you to increase her Zolpidem

## 2018-07-31 ENCOUNTER — Other Ambulatory Visit: Payer: Self-pay | Admitting: Medical

## 2018-07-31 MED ORDER — ZOLPIDEM TARTRATE ER 6.25 MG PO TBCR: 6.2500 mg | EXTENDED_RELEASE_TABLET | Freq: Every evening | ORAL | 0 refills | Status: DC | PRN

## 2018-07-31 NOTE — Telephone Encounter (Signed)
I would like to try Ambien CR or zolpidem CR 6.25 mg daily at bedtime.  This is stronger than what she was taking but I do not feel comfortable going to a higher dose.  Please call this out to a local pharmacy has a trial  F/u 3-4 weeks on this

## 2018-08-01 NOTE — Telephone Encounter (Signed)
Called pt and informed, she wants it called into [facility] t# [phone] which I did and she will let us know how this does for her.

## 2018-08-04 ENCOUNTER — Other Ambulatory Visit: Payer: Self-pay | Admitting: Medical

## 2018-08-04 MED ORDER — ZOLPIDEM TARTRATE 10 MG PO TABS: 10.0000 mg | ORAL_TABLET | Freq: Every evening | ORAL | 0 refills | Status: DC | PRN

## 2018-08-09 ENCOUNTER — Telehealth: Payer: Self-pay | Admitting: Medical

## 2018-08-09 NOTE — Telephone Encounter (Signed)
P.A. AMBIEN CR

## 2018-08-14 ENCOUNTER — Telehealth: Payer: Self-pay

## 2018-08-14 ENCOUNTER — Other Ambulatory Visit: Payer: Self-pay | Admitting: Medical

## 2018-08-14 MED ORDER — TRAZODONE HCL 50 MG PO TABS: 50.0000 mg | ORAL_TABLET | Freq: Every day | ORAL | 0 refills | Status: DC

## 2018-08-14 NOTE — Telephone Encounter (Signed)
Insurance is not going to approve the Ambien so I sent trazodone to the pharmacy instead.  Let us give this a 30-day trial.  I sent this to [facility].  Let us have her recheck in 1 month on insomnia

## 2018-08-14 NOTE — Telephone Encounter (Signed)
Patient has sent a fax stating the 10mg  Zolpidem has exceeded the maximum recommended dose of 5mg  for Geriatric patients . Please confirm that the ordered dosage is correct or if it otherwise needs to be changed to 5mg .

## 2018-08-15 NOTE — Telephone Encounter (Signed)
Patient was informed of medication change. Patient stated she will call back to schedule her appointment for next month because she does not know what will happen between now and then.

## 2018-08-23 NOTE — Telephone Encounter (Signed)
Ambien CR was denied not on [employer]'s preferred list.  Emily Cowan has switched medication.

## 2018-10-23 ENCOUNTER — Other Ambulatory Visit: Payer: Self-pay | Admitting: Medical

## 2018-10-23 ENCOUNTER — Telehealth: Payer: Self-pay

## 2018-10-23 NOTE — Telephone Encounter (Signed)
[facility] sent a ax stating that patient is requesting refill to be sent to them for Zolpidem.

## 2018-10-24 NOTE — Telephone Encounter (Signed)
This is supposed to be trazodone correct for 90 day supply?    After her last phone message in phone back-and-forth we advise insurance would no longer cover Ambien as this is considered unsafe so we decided to discontinue Ambien/zolpidem

## 2018-11-02 ENCOUNTER — Other Ambulatory Visit: Payer: Self-pay | Admitting: Medical

## 2018-11-02 MED ORDER — ZOLPIDEM TARTRATE 5 MG PO TABS: 5.0000 mg | ORAL_TABLET | Freq: Every evening | ORAL | 0 refills | Status: DC | PRN

## 2019-02-11 ENCOUNTER — Other Ambulatory Visit: Payer: Self-pay | Admitting: Medical

## 2019-02-11 MED ORDER — ZOLPIDEM TARTRATE 5 MG PO TABS: 5.0000 mg | ORAL_TABLET | Freq: Every evening | ORAL | 0 refills | Status: DC | PRN

## 2019-03-04 DIAGNOSIS — R69 Illness, unspecified: Secondary | ICD-10-CM | POA: Diagnosis not present

## 2019-05-22 ENCOUNTER — Other Ambulatory Visit: Payer: Self-pay | Admitting: Medical

## 2019-05-25 ENCOUNTER — Telehealth: Payer: Self-pay | Admitting: Medical

## 2019-05-25 ENCOUNTER — Other Ambulatory Visit: Payer: Self-pay | Admitting: Medical

## 2019-05-25 MED ORDER — ZOLPIDEM TARTRATE 5 MG PO TABS: 5.0000 mg | ORAL_TABLET | Freq: Every evening | ORAL | 0 refills | Status: DC | PRN

## 2019-05-25 NOTE — Telephone Encounter (Signed)
Pharmacy sent refill request for Zolpidem 5 mg  Please send refills to [place], [place]

## 2019-05-25 NOTE — Telephone Encounter (Signed)
Pt has a appt 06/25/19 for a medicare well visit

## 2019-05-25 NOTE — Telephone Encounter (Signed)
Medication sent.  Get in for physical/wellness

## 2019-06-17 ENCOUNTER — Telehealth: Payer: Self-pay | Admitting: Medical

## 2019-06-17 NOTE — Telephone Encounter (Signed)
Please call patient.  She sent me an email.  I understand if she is concerned about exposure.  Please let her know the precautions we are taking.  However if she does not want to come in at this time, lets schedule a 30-minute televisit med check appointment then  I certainly recommend a flu shot yearly, which can be done here or local pharmacy

## 2019-06-17 NOTE — Telephone Encounter (Signed)
Patient has scheduled her flu shot for tomorrow and has decided to keep her visit for the 29th but do a virtual med check.

## 2019-06-18 ENCOUNTER — Other Ambulatory Visit: Payer: Self-pay

## 2019-06-18 ENCOUNTER — Other Ambulatory Visit (INDEPENDENT_AMBULATORY_CARE_PROVIDER_SITE_OTHER): Payer: Medicare HMO

## 2019-06-18 DIAGNOSIS — Z23 Encounter for immunization: Secondary | ICD-10-CM | POA: Diagnosis not present

## 2019-06-25 ENCOUNTER — Encounter: Payer: Self-pay | Admitting: Medical

## 2019-06-25 ENCOUNTER — Other Ambulatory Visit: Payer: Self-pay

## 2019-06-25 ENCOUNTER — Ambulatory Visit (INDEPENDENT_AMBULATORY_CARE_PROVIDER_SITE_OTHER): Payer: Medicare HMO | Admitting: Medical

## 2019-06-25 VITALS — Ht 67.5 in | Wt 159.0 lb

## 2019-06-25 DIAGNOSIS — M810 Age-related osteoporosis without current pathological fracture: Secondary | ICD-10-CM | POA: Diagnosis not present

## 2019-06-25 DIAGNOSIS — G4700 Insomnia, unspecified: Secondary | ICD-10-CM | POA: Diagnosis not present

## 2019-06-25 DIAGNOSIS — E559 Vitamin D deficiency, unspecified: Secondary | ICD-10-CM | POA: Diagnosis not present

## 2019-06-25 DIAGNOSIS — H9190 Unspecified hearing loss, unspecified ear: Secondary | ICD-10-CM

## 2019-06-25 DIAGNOSIS — G8929 Other chronic pain: Secondary | ICD-10-CM

## 2019-06-25 DIAGNOSIS — E2839 Other primary ovarian failure: Secondary | ICD-10-CM

## 2019-06-25 DIAGNOSIS — Z853 Personal history of malignant neoplasm of breast: Secondary | ICD-10-CM

## 2019-06-25 DIAGNOSIS — F341 Dysthymic disorder: Secondary | ICD-10-CM | POA: Diagnosis not present

## 2019-06-25 DIAGNOSIS — E785 Hyperlipidemia, unspecified: Secondary | ICD-10-CM

## 2019-06-25 DIAGNOSIS — F1011 Alcohol abuse, in remission: Secondary | ICD-10-CM

## 2019-06-25 DIAGNOSIS — N94819 Vulvodynia, unspecified: Secondary | ICD-10-CM

## 2019-06-25 DIAGNOSIS — M5030 Other cervical disc degeneration, unspecified cervical region: Secondary | ICD-10-CM

## 2019-06-25 DIAGNOSIS — K529 Noninfective gastroenteritis and colitis, unspecified: Secondary | ICD-10-CM

## 2019-06-25 DIAGNOSIS — K219 Gastro-esophageal reflux disease without esophagitis: Secondary | ICD-10-CM

## 2019-06-25 DIAGNOSIS — M5416 Radiculopathy, lumbar region: Secondary | ICD-10-CM

## 2019-06-25 DIAGNOSIS — M542 Cervicalgia: Secondary | ICD-10-CM

## 2019-06-25 DIAGNOSIS — J301 Allergic rhinitis due to pollen: Secondary | ICD-10-CM

## 2019-06-25 DIAGNOSIS — Z7189 Other specified counseling: Secondary | ICD-10-CM

## 2019-06-25 DIAGNOSIS — Z974 Presence of external hearing-aid: Secondary | ICD-10-CM

## 2019-06-25 DIAGNOSIS — Z1239 Encounter for other screening for malignant neoplasm of breast: Secondary | ICD-10-CM

## 2019-06-25 DIAGNOSIS — Z7185 Encounter for immunization safety counseling: Secondary | ICD-10-CM

## 2019-06-25 NOTE — Progress Notes (Signed)
Done

## 2019-06-25 NOTE — Patient Instructions (Addendum)
It was good to talk to you today   Recommendations  Osteoporosis, vitamin D deficiency  Continue your vitamin D supplement, continue Boniva monthly  Get aerobic and weightbearing exercise regularly  High cholesterol-continue your current medication    Please call to schedule your mammogram and bone density test.   [facility] [address], [place], [place] [address]   Vaccines: Call and check insurance coverage for tetanus diphtheria vaccine and the Shingrix vaccine as you are due for both  You are up-to-date on flu shot and pneumococcal shots    At your convenience, call to schedule a fasting lab visit.  Verdis Frederickson, our lab person can work to accomodate labs in your car, particular if its not raining.  I spoke to her about this.

## 2019-06-25 NOTE — Progress Notes (Signed)
Subjective: Chief Complaint  Patient presents with  . Medication Management   This visit type was conducted due to national recommendations for restrictions regarding the COVID-19 Pandemic (e.g. social distancing) in an effort to limit this patient's exposure and mitigate transmission in our community.  Due to their co-morbid illnesses, this patient is at least at moderate risk for complications without adequate follow up.  This format is felt to be most appropriate for this patient at this time.    Documentation for virtual audio and video telecommunications through Zoom encounter:  The patient was located at home. The provider was located in the office. The patient did consent to this visit and is aware of possible charges through their insurance for this visit.  The other persons participating in this telemedicine service were none. Time spent on call was 25 minutes and in review of previous records >25 minutes total.  This virtual service is not related to other E/M service within previous 7 days.   Medical team: Eye doctor Dentist Dr. Zenovia Jarred, gastroenterology Yunuen Mordan, Camelia Eng, PA-C, here for primary care  Virtual consult today for med check.  Given her age and health, she was very scared to come in person for a visit.  Overall she has been doing okay physically.  She has been a little down with her mood, really concerned about the election and politics.  She is also concerned about Covid.  She has been particularly careful.  She lives alone and can be quite isolated.  She is using precautions such as having groceries placed in her car instead of having to go into the store and shop.  She is limiting exposures with people.  Otherwise no physical complaints currently.  Hyperlipidemia-she is compliant with Crestor 10 mg daily without complaint  Osteoporosis-she is compliant with Boniva monthly.  She also is taking her vitamin D supplement  She continues on fluoxetine 40 mg  daily for mood  She uses Ambien regularly to help with sleep.  Past Medical History:  Diagnosis Date  . Alcohol abuse   . Allergic rhinitis   . Cancer ([place]) 2002   BREAST  . Depression   . Family history of cancer    multiple  . Hearing impaired    wears hearing aids  . Hemangioma of liver 2005   MRI   . Osteoporosis   . Seborrheic dermatitis   . Vulvodynia    longstanding, failed Premarin cream; can't tolerate pelvic exams  . Wears glasses     Past Surgical History:  Procedure Laterality Date  . COLONOSCOPY  2011   in [facility]  . COLONOSCOPY  2018  . MASTECTOMY  2002   right  . TONSILLECTOMY    . WISDOM TOOTH EXTRACTION      Social History   Socioeconomic History  . Marital status: Single    Spouse name: Not on file  . Number of children: Not on file  . Years of education: Not on file  . Highest education level: Not on file  Occupational History  . Not on file  Social Needs  . Financial resource strain: Not on file  . Food insecurity    Worry: Not on file    Inability: Not on file  . Transportation needs    Medical: Not on file    Non-medical: Not on file  Tobacco Use  . Smoking status: Former Smoker    Quit date: 1992    Years since quitting: 28.8  . Smokeless tobacco:  Never Used  Substance and Sexual Activity  . Alcohol use: No    Alcohol/week: 0.0 standard drinks  . Drug use: No  . Sexual activity: Not Currently  Lifestyle  . Physical activity    Days per week: Not on file    Minutes per session: Not on file  . Stress: Not on file  Relationships  . Social Herbalist on phone: Not on file    Gets together: Not on file    Attends religious service: Not on file    Active member of club or organization: Not on file    Attends meetings of clubs or organizations: Not on file    Relationship status: Not on file  . Intimate partner violence    Fear of current or ex partner: Not on file    Emotionally abused: Not on file     Physically abused: Not on file    Forced sexual activity: Not on file  Other Topics Concern  . Not on file  Social History Narrative   Single, no children, retired, lesbian but no current relationship; exercise- working in the yard, cutting down trees, Biomedical scientist, walking the dogs.  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 Cancer Mother        GI cancer  . Cancer Father        liver  . Stroke Father   . Cancer Sister        vulvar  . Cancer Brother        liver  . Cancer Brother        lung  . Diabetes Neg Hx   . Heart disease Neg Hx   . Hypertension Neg Hx   . Hyperlipidemia Neg Hx   . Breast cancer Neg Hx      Current Outpatient Medications:  .  Ascorbic Acid (VITAMIN C) 500 MG CAPS, , Disp: , Rfl:  .  calcium carbonate (OS-CAL) 600 MG TABS tablet, Take 600 mg by mouth daily with breakfast., Disp: , Rfl:  .  Cholecalciferol (VITAMIN D) 2000 units CAPS, Take 2,000 capsules by mouth 1 day or 1 dose., Disp: , Rfl:  .  FLUoxetine (PROZAC) 40 MG capsule, TAKE 1 CAPSULE EVERY DAY, Disp: 90 capsule, Rfl: 3 .  Glucosamine-Chondroit-Vit C-Mn (GLUCOSAMINE CHONDR 1500 COMPLX) CAPS, Take 1 capsule by mouth daily.  , Disp: , Rfl:  .  ibandronate (BONIVA) 150 MG tablet, TAKE 1 TABLET  EVERY  30  DAYS, Disp: 4 tablet, Rfl: 3 .  Multiple Vitamins-Minerals (MULTIVITAMIN WITH MINERALS) tablet, Take 1 tablet by mouth daily.  , Disp: , Rfl:  .  omeprazole (PRILOSEC) 40 MG capsule, TAKE 1 CAPSULE EVERY DAY, Disp: 90 capsule, Rfl: 3 .  rosuvastatin (CRESTOR) 10 MG tablet, TAKE 1 TABLET AT BEDTIME, Disp: 90 tablet, Rfl: 0 .  zolpidem (AMBIEN) 5 MG tablet, Take 1 tablet (5 mg total) by mouth at bedtime as needed. for sleep, Disp: 90 tablet, Rfl: 0 .  HYDROcodone-acetaminophen (NORCO) 5-325 MG tablet, Take 1 tablet by mouth every 6 (six) hours as needed for moderate pain. (Patient not taking: Reported on 06/23/2018), Disp: 30 tablet, Rfl: 0 .  ibuprofen  (ADVIL,MOTRIN) 800 MG tablet, Take 1 tablet (800 mg total) by mouth every 8 (eight) hours as needed. (Patient not taking: Reported on 06/25/2019), Disp: 30 tablet, Rfl: 0  Current Facility-Administered Medications:  .  0.9 %  sodium chloride infusion, 500  mL, Intravenous, Continuous, Pyrtle, Lajuan Lines, MD  No Known Allergies    Objective: Ht 5' 7.5" (1.715 m)   Wt 159 lb (72.1 kg)   BMI 24.54 kg/m   Not examined in person due to televisit and Covid precautions  Wt Readings from Last 3 Encounters:  06/25/19 159 lb (72.1 kg)  06/23/18 153 lb 3.2 oz (69.5 kg)  06/20/17 156 lb 12.8 oz (71.1 kg)      Assessment: Encounter Diagnoses  Name Primary?  . Hyperlipidemia, unspecified hyperlipidemia type Yes  . Osteoporosis, unspecified osteoporosis type, unspecified pathological fracture presence   . Estrogen deficiency   . Vaccine counseling   . Vitamin D deficiency   . Insomnia, unspecified type   . Dysthymia   . History of breast cancer in female   . Vulvodynia   . DDD (degenerative disc disease), cervical   . Hearing loss, unspecified hearing loss type, unspecified laterality   . Lumbar back pain with radiculopathy affecting right lower extremity   . Gastroesophageal reflux disease without esophagitis   . Allergic rhinitis due to pollen, unspecified seasonality   . Chronic diarrhea   . Chronic neck pain   . History of alcohol abuse   . Wears hearing aid   . Encounter for screening for malignant neoplasm of breast, unspecified screening modality     Plan: We reviewed her medications, we discussed her concerns, Covid precautions, preventative care.  Vaccines: She is up-to-date on flu shot done in October recently  She will be due for Td vaccine in 2021  Shingles vaccine:  I recommend you have a shingles vaccine to help prevent shingles or herpes zoster outbreak.   Please call your insurer to inquire about coverage for the Shingrix vaccine given in 2 doses.   Some insurers  cover this vaccine after age 52, some cover this after age 37.  If your insurer covers this, then call to schedule appointment to have this vaccine here.  She has had both pneumococcal vaccines.   Cancer screening: Advise mammogram, last mammogram 2018.  Order will be placed  Reviewed colonoscopy from 2018, Dr. Zenovia Jarred  She continues to decline pelvic exams due to past severe vulvodynia and pain with speculum exam.  She requests CA-125 periodically to use as a modified screen    Chronic issues: Advise she come in for fasting labs at her convenience.   Osteoporosis - due for repeat bone density test.   Continue Boniva monthly.  She will schedule at her convenience.   Hyperlipidemia -continue statin, return for fasting labs soon  Vitamin D deficiency-continue vitamin D supplement, counseled on diet measures, exercise  Insomnia-we discussed Ambien safety, she continues to use as needed  Dysthymia-doing relatively okay on fluoxetine.  We discussed maybe not watching news on TV but getting news through apps or printed material, not live video.  Wears hearing aids  GERD no current reported problems, continues on medication which helps   I reviewed her problem list and she does not currently report any major issues with other listed problems such as diarrhea, pain, or other concerns at this time   Blakelynn was seen today for medication management.  Diagnoses and all orders for this visit:  Hyperlipidemia, unspecified hyperlipidemia type -     Cancel: Comprehensive metabolic panel -     Cancel: Lipid panel -     Comprehensive metabolic panel; Future -     Lipid panel; Future  Osteoporosis, unspecified osteoporosis type, unspecified pathological fracture presence -  Cancel: VITAMIN D 25 Hydroxy (Vit-D Deficiency, Fractures) -     DG Bone Density; Future  Estrogen deficiency -     VITAMIN D 25 Hydroxy (Vit-D Deficiency, Fractures); Future -     CA 125; Future -     DG  Bone Density; Future  Vaccine counseling  Vitamin D deficiency -     Cancel: VITAMIN D 25 Hydroxy (Vit-D Deficiency, Fractures) -     VITAMIN D 25 Hydroxy (Vit-D Deficiency, Fractures); Future -     DG Bone Density; Future  Insomnia, unspecified type  Dysthymia -     Cancel: CBC with Differential/Platelet  History of breast cancer in female -     Cancel: CBC with Differential/Platelet -     Cancel: CA 125 -     CA 125; Future  Vulvodynia -     Cancel: CA 125 -     CA 125; Future  DDD (degenerative disc disease), cervical  Hearing loss, unspecified hearing loss type, unspecified laterality  Lumbar back pain with radiculopathy affecting right lower extremity  Gastroesophageal reflux disease without esophagitis -     CBC with Differential/Platelet; Future  Allergic rhinitis due to pollen, unspecified seasonality  Chronic diarrhea -     Cancel: CBC with Differential/Platelet  Chronic neck pain  History of alcohol abuse -     CBC with Differential/Platelet; Future  Wears hearing aid  Encounter for screening for malignant neoplasm of breast, unspecified screening modality -     MM DIGITAL SCREENING BILATERAL; Future   F/u for studies, labs

## 2019-07-17 ENCOUNTER — Other Ambulatory Visit: Payer: Self-pay | Admitting: Medical

## 2019-07-21 ENCOUNTER — Other Ambulatory Visit: Payer: Self-pay | Admitting: Medical

## 2019-07-21 MED ORDER — ZOLPIDEM TARTRATE 5 MG PO TABS: 5.0000 mg | ORAL_TABLET | Freq: Every evening | ORAL | 0 refills | Status: DC | PRN

## 2019-07-30 ENCOUNTER — Other Ambulatory Visit: Payer: Medicare HMO

## 2019-07-30 ENCOUNTER — Other Ambulatory Visit: Payer: Self-pay

## 2019-07-30 DIAGNOSIS — N94819 Vulvodynia, unspecified: Secondary | ICD-10-CM

## 2019-07-30 DIAGNOSIS — E2839 Other primary ovarian failure: Secondary | ICD-10-CM

## 2019-07-30 DIAGNOSIS — F1011 Alcohol abuse, in remission: Secondary | ICD-10-CM | POA: Diagnosis not present

## 2019-07-30 DIAGNOSIS — Z853 Personal history of malignant neoplasm of breast: Secondary | ICD-10-CM

## 2019-07-30 DIAGNOSIS — E559 Vitamin D deficiency, unspecified: Secondary | ICD-10-CM

## 2019-07-30 DIAGNOSIS — E785 Hyperlipidemia, unspecified: Secondary | ICD-10-CM | POA: Diagnosis not present

## 2019-07-30 DIAGNOSIS — Z124 Encounter for screening for malignant neoplasm of cervix: Secondary | ICD-10-CM | POA: Diagnosis not present

## 2019-07-30 DIAGNOSIS — K219 Gastro-esophageal reflux disease without esophagitis: Secondary | ICD-10-CM | POA: Diagnosis not present

## 2019-07-31 ENCOUNTER — Other Ambulatory Visit: Payer: Self-pay | Admitting: Medical

## 2019-07-31 LAB — COMPREHENSIVE METABOLIC PANEL
ALT: 23 IU/L (ref 0–32)
AST: 20 IU/L (ref 0–40)
Albumin/Globulin Ratio: 1.9 (ref 1.2–2.2)
Albumin: 4.3 g/dL (ref 3.7–4.7)
Alkaline Phosphatase: 64 IU/L (ref 39–117)
BUN/Creatinine Ratio: 20 (ref 12–28)
BUN: 14 mg/dL (ref 8–27)
Bilirubin Total: 0.4 mg/dL (ref 0.0–1.2)
CO2: 24 mmol/L (ref 20–29)
Calcium: 9.4 mg/dL (ref 8.7–10.3)
Chloride: 106 mmol/L (ref 96–106)
Creatinine, Ser: 0.7 mg/dL (ref 0.57–1.00)
GFR calc Af Amer: 99 mL/min/{1.73_m2} (ref >59)
GFR calc non Af Amer: 86 mL/min/{1.73_m2} (ref >59)
Globulin, Total: 2.3 g/dL (ref 1.5–4.5)
Glucose: 93 mg/dL (ref 65–99)
Potassium: 4.9 mmol/L (ref 3.5–5.2)
Sodium: 141 mmol/L (ref 134–144)
Total Protein: 6.6 g/dL (ref 6.0–8.5)

## 2019-07-31 LAB — CBC WITH DIFFERENTIAL/PLATELET
Basophils Absolute: 0 10*3/uL (ref 0.0–0.2)
Basos: 1 %
EOS (ABSOLUTE): 0.1 10*3/uL (ref 0.0–0.4)
Eos: 1 %
Hematocrit: 38.8 % (ref 34.0–46.6)
Hemoglobin: 12.9 g/dL (ref 11.1–15.9)
Immature Grans (Abs): 0 10*3/uL (ref 0.0–0.1)
Immature Granulocytes: 0 %
Lymphocytes Absolute: 1.8 10*3/uL (ref 0.7–3.1)
Lymphs: 30 %
MCH: 31.9 pg (ref 26.6–33.0)
MCHC: 33.2 g/dL (ref 31.5–35.7)
MCV: 96 fL (ref 79–97)
Monocytes Absolute: 0.7 10*3/uL (ref 0.1–0.9)
Monocytes: 11 %
Neutrophils Absolute: 3.4 10*3/uL (ref 1.4–7.0)
Neutrophils: 57 %
Platelets: 292 10*3/uL (ref 150–450)
RBC: 4.04 x10E6/uL (ref 3.77–5.28)
RDW: 12.4 % (ref 11.7–15.4)
WBC: 5.9 10*3/uL (ref 3.4–10.8)

## 2019-07-31 LAB — LIPID PANEL
Chol/HDL Ratio: 2.4 ratio (ref 0.0–4.4)
Cholesterol, Total: 158 mg/dL (ref 100–199)
HDL: 66 mg/dL (ref >39)
LDL Chol Calc (NIH): 76 mg/dL (ref 0–99)
Triglycerides: 88 mg/dL (ref 0–149)
VLDL Cholesterol Cal: 16 mg/dL (ref 5–40)

## 2019-07-31 LAB — VITAMIN D 25 HYDROXY (VIT D DEFICIENCY, FRACTURES): Vit D, 25-Hydroxy: 45.5 ng/mL (ref 30.0–100.0)

## 2019-07-31 LAB — CA 125: Cancer Antigen (CA) 125: 5.8 U/mL (ref 0.0–38.1)

## 2019-09-30 ENCOUNTER — Other Ambulatory Visit: Payer: Self-pay | Admitting: Medical

## 2019-09-30 NOTE — Telephone Encounter (Signed)
I received a fax from [facility] stating the pt. Needs a refill on her Zolpidem 5mg  pt. Last apt. Was 06/25/19.

## 2019-10-02 ENCOUNTER — Other Ambulatory Visit: Payer: Self-pay | Admitting: Medical

## 2019-10-02 MED ORDER — ZOLPIDEM TARTRATE 5 MG PO TABS: 5.0000 mg | ORAL_TABLET | Freq: Every evening | ORAL | 0 refills | Status: DC | PRN

## 2019-10-05 ENCOUNTER — Other Ambulatory Visit: Payer: Self-pay | Admitting: Medical

## 2019-11-18 ENCOUNTER — Other Ambulatory Visit: Payer: Self-pay

## 2019-11-18 ENCOUNTER — Ambulatory Visit (INDEPENDENT_AMBULATORY_CARE_PROVIDER_SITE_OTHER): Payer: Medicare HMO | Admitting: Medical

## 2019-11-18 VITALS — BP 130/80 | HR 80 | Temp 98.2°F | Ht 67.5 in | Wt 165.2 lb

## 2019-11-18 DIAGNOSIS — G8929 Other chronic pain: Secondary | ICD-10-CM | POA: Insufficient documentation

## 2019-11-18 DIAGNOSIS — M25611 Stiffness of right shoulder, not elsewhere classified: Secondary | ICD-10-CM | POA: Diagnosis not present

## 2019-11-18 DIAGNOSIS — Z974 Presence of external hearing-aid: Secondary | ICD-10-CM | POA: Diagnosis not present

## 2019-11-18 DIAGNOSIS — M25511 Pain in right shoulder: Secondary | ICD-10-CM

## 2019-11-18 DIAGNOSIS — M542 Cervicalgia: Secondary | ICD-10-CM

## 2019-11-18 MED ORDER — HYDROCODONE-ACETAMINOPHEN 5-325 MG PO TABS: 1.0000 | ORAL_TABLET | Freq: Two times a day (BID) | ORAL | 0 refills | Status: DC | PRN

## 2019-11-18 NOTE — Progress Notes (Addendum)
Subjective: Chief Complaint  Patient presents with  . Shoulder Pain    right    Here for right shoulder pain.   She has had some prior problems with right shoulder.   Hurts to sleep, hurts washing hair.   Sometimes just sitting still it hurts.   Has gradual worsening of pain.  No recent fall, no injury, no trauma.   Always has pain in neck, but mostly having shoulder pain.  Sometimes front of shoulder, sometimes posterior shoulder, lately below shoulder blade.  No distal arm pain.   No numbness or tingling in the arm.   Using some old left over hydrocodone, but in general doesn't like to take medicaiton.  No other aggravating or relieving factors.  No other complaint.  Past Medical History:  Diagnosis Date  . Alcohol abuse   . Allergic rhinitis   . Cancer ([place]) 2002   BREAST  . Depression   . Family history of cancer    multiple  . Hearing impaired    wears hearing aids  . Hemangioma of liver 2005   MRI   . Osteoporosis   . Seborrheic dermatitis   . Vulvodynia    longstanding, failed Premarin cream; can't tolerate pelvic exams  . Wears glasses    Current Outpatient Medications on File Prior to Visit  Medication Sig Dispense Refill  . Ascorbic Acid (VITAMIN C) 500 MG CAPS     . calcium carbonate (OS-CAL) 600 MG TABS tablet Take 600 mg by mouth daily with breakfast.    . Cholecalciferol (VITAMIN D) 2000 units CAPS Take 2,000 capsules by mouth 1 day or 1 dose.    Marland Kitchen FLUoxetine (PROZAC) 40 MG capsule TAKE 1 CAPSULE EVERY DAY 90 capsule 0  . Glucosamine-Chondroit-Vit C-Mn (GLUCOSAMINE CHONDR 1500 COMPLX) CAPS Take 1 capsule by mouth daily.      Marland Kitchen ibandronate (BONIVA) 150 MG tablet TAKE 1 TABLET  EVERY  30  DAYS 3 tablet 2  . Multiple Vitamins-Minerals (MULTIVITAMIN WITH MINERALS) tablet Take 1 tablet by mouth daily.      Marland Kitchen omeprazole (PRILOSEC) 40 MG capsule TAKE 1 CAPSULE EVERY DAY 90 capsule 3  . rosuvastatin (CRESTOR) 10 MG tablet TAKE 1 TABLET AT BEDTIME 90 tablet 0  . zolpidem  (AMBIEN) 5 MG tablet Take 1 tablet (5 mg total) by mouth at bedtime as needed. for sleep 90 tablet 0  . ibuprofen (ADVIL,MOTRIN) 800 MG tablet Take 1 tablet (800 mg total) by mouth every 8 (eight) hours as needed. (Patient not taking: Reported on 06/25/2019) 30 tablet 0   Current Facility-Administered Medications on File Prior to Visit  Medication Dose Route Frequency Provider Last Rate Last Admin  . 0.9 %  sodium chloride infusion  500 mL Intravenous Continuous Pyrtle, Lajuan Lines, MD       ROS as in subjective    Objective: BP 130/80   Pulse 80   Temp 98.2 F (36.8 C)   Ht 5' 7.5" (1.715 m)   Wt 165 lb 3.2 oz (74.9 kg)   SpO2 96%   BMI 25.49 kg/m    Gen: wd, wn, nad Skin: unremarkable Neck: somewhat decreased ROM in general, nontender, no mass, no lymphadenopathy MSK: right shoulder tender over AC joint and bicep origin, otherwise nontender, pain with ROM in general, pain with hawkins and neers, pain with apprehension test, pain and mild grind sensation with labrum test, but ROM seems full.  Rest of arms nontender, normal ROM without deformity Arms with normal pulse, sensation,  DTRs, strength   Assessment: Encounter Diagnoses  Name Primary?  . Chronic right shoulder pain Yes  . Decreased range of motion of right shoulder   . Chronic neck pain   . Wears hearing aid      Plan: Discussed concerns, possible differential.   interestingly 2017 xray didn't show arthritis of right shoulder but did show DDD of cervical spine.   Medicaiton as below short term.  Discussed risks/benefits of medicaiton.   Go for updated xray.    Benita was seen today for shoulder pain.  Diagnoses and all orders for this visit:  Chronic right shoulder pain -     DG Shoulder Right; Future  Decreased range of motion of right shoulder -     DG Shoulder Right; Future  Chronic neck pain  Wears hearing aid  Other orders -     HYDROcodone-acetaminophen (NORCO) 5-325 MG tablet; Take 1 tablet by  mouth 2 (two) times daily as needed for moderate pain.

## 2019-11-18 NOTE — Patient Instructions (Signed)
Please go to [facility] for your back xray.   Their hours are 8am - 4:30 pm Monday - Friday.  Take your insurance card with you. ° °[facility] °[phone] ° °[address], [address] °[place], [place] [address] ° °[address] °[place], [place] [address]  °

## 2019-11-19 ENCOUNTER — Other Ambulatory Visit: Payer: Self-pay | Admitting: Medical

## 2019-11-19 ENCOUNTER — Ambulatory Visit
Admission: RE | Admit: 2019-11-19 | Discharge: 2019-11-19 | Disposition: A | Discharge status: Home / Self Care | Payer: Medicare HMO | Source: Ambulatory Visit | Attending: Medical | Admitting: Medical

## 2019-11-19 DIAGNOSIS — M25511 Pain in right shoulder: Secondary | ICD-10-CM

## 2019-11-19 DIAGNOSIS — M25611 Stiffness of right shoulder, not elsewhere classified: Secondary | ICD-10-CM

## 2019-11-19 DIAGNOSIS — G8929 Other chronic pain: Secondary | ICD-10-CM

## 2019-11-19 MED ORDER — HYDROCODONE-ACETAMINOPHEN 5-325 MG PO TABS: 1.0000 | ORAL_TABLET | Freq: Two times a day (BID) | ORAL | 0 refills | Status: DC | PRN

## 2019-11-30 ENCOUNTER — Telehealth: Payer: Self-pay | Admitting: Medical

## 2019-11-30 DIAGNOSIS — M542 Cervicalgia: Secondary | ICD-10-CM

## 2019-11-30 DIAGNOSIS — M25611 Stiffness of right shoulder, not elsewhere classified: Secondary | ICD-10-CM

## 2019-11-30 DIAGNOSIS — G8929 Other chronic pain: Secondary | ICD-10-CM

## 2019-11-30 NOTE — Telephone Encounter (Signed)
See other message.  Referral for neck and shoulder pain, chronic

## 2019-12-01 ENCOUNTER — Telehealth: Payer: Self-pay | Admitting: Internal Medicine

## 2019-12-01 NOTE — Telephone Encounter (Signed)
Left message for pt to call back to schedule awv.

## 2019-12-02 ENCOUNTER — Other Ambulatory Visit: Payer: Self-pay

## 2019-12-02 DIAGNOSIS — G8929 Other chronic pain: Secondary | ICD-10-CM

## 2019-12-02 DIAGNOSIS — M542 Cervicalgia: Secondary | ICD-10-CM

## 2019-12-21 DIAGNOSIS — M67911 Unspecified disorder of synovium and tendon, right shoulder: Secondary | ICD-10-CM | POA: Diagnosis not present

## 2019-12-23 DIAGNOSIS — M7541 Impingement syndrome of right shoulder: Secondary | ICD-10-CM | POA: Diagnosis not present

## 2019-12-25 DIAGNOSIS — M7541 Impingement syndrome of right shoulder: Secondary | ICD-10-CM | POA: Diagnosis not present

## 2019-12-27 ENCOUNTER — Other Ambulatory Visit: Payer: Self-pay

## 2019-12-28 ENCOUNTER — Encounter: Payer: Self-pay | Admitting: Internal Medicine

## 2019-12-28 MED ORDER — ROSUVASTATIN CALCIUM 10 MG PO TABS: 10.0000 mg | ORAL_TABLET | Freq: Every day | ORAL | 0 refills | Status: DC

## 2019-12-30 DIAGNOSIS — M7541 Impingement syndrome of right shoulder: Secondary | ICD-10-CM | POA: Diagnosis not present

## 2020-01-01 DIAGNOSIS — M7541 Impingement syndrome of right shoulder: Secondary | ICD-10-CM | POA: Diagnosis not present

## 2020-01-07 DIAGNOSIS — Z961 Presence of intraocular lens: Secondary | ICD-10-CM | POA: Diagnosis not present

## 2020-01-13 DIAGNOSIS — M7541 Impingement syndrome of right shoulder: Secondary | ICD-10-CM | POA: Diagnosis not present

## 2020-01-18 ENCOUNTER — Other Ambulatory Visit: Payer: Self-pay | Admitting: Medical

## 2020-01-19 ENCOUNTER — Telehealth: Payer: Self-pay

## 2020-01-19 MED ORDER — FLUOXETINE HCL 40 MG PO CAPS: 40.0000 mg | ORAL_CAPSULE | Freq: Every day | ORAL | 0 refills | Status: DC

## 2020-01-19 NOTE — Telephone Encounter (Signed)
Received a fax from [facility] for a refill on the pts. Zolpidem 5mg  pt. Last apt was 11/18/19.

## 2020-01-19 NOTE — Telephone Encounter (Signed)
Med was sent again

## 2020-01-19 NOTE — Addendum Note (Signed)
Addended by: Edgar Frisk on: 01/19/2020 12:23 PM   Modules accepted: Orders

## 2020-01-20 NOTE — Telephone Encounter (Signed)
Please set up med check appt, can be virtual.       FYI to [place]  Next visit, discuss Ambien regular release alternatives

## 2020-01-20 NOTE — Telephone Encounter (Signed)
Message was sent to patient via mychart to call and schedule her appointment.

## 2020-01-27 ENCOUNTER — Other Ambulatory Visit: Payer: Self-pay

## 2020-01-27 ENCOUNTER — Ambulatory Visit (AMBULATORY_SURGERY_CENTER): Payer: Self-pay

## 2020-01-27 VITALS — Ht 67.5 in | Wt 163.6 lb

## 2020-01-27 DIAGNOSIS — Z86010 Personal history of colonic polyps: Secondary | ICD-10-CM

## 2020-01-27 MED ORDER — SUTAB 1479-225-188 MG PO TABS: 12.0000 | ORAL_TABLET | ORAL | 0 refills | Status: DC

## 2020-01-27 NOTE — Progress Notes (Signed)
No allergies to soy or egg Pt is not on blood thinners or diet pills Denies issues with sedation/intubation Denies atrial flutter/fib Denies constipation   Emmi instructions given to pt  Pt is aware of Covid safety and care partner requirements.  

## 2020-02-09 ENCOUNTER — Encounter: Payer: Self-pay | Admitting: Internal Medicine

## 2020-02-09 ENCOUNTER — Ambulatory Visit (AMBULATORY_SURGERY_CENTER): Payer: Medicare HMO | Admitting: Internal Medicine

## 2020-02-09 ENCOUNTER — Other Ambulatory Visit: Payer: Self-pay

## 2020-02-09 VITALS — BP 110/72 | HR 63 | Temp 98.2°F | Resp 17 | Ht 67.5 in | Wt 163.6 lb

## 2020-02-09 DIAGNOSIS — D120 Benign neoplasm of cecum: Secondary | ICD-10-CM | POA: Diagnosis not present

## 2020-02-09 DIAGNOSIS — Z86010 Personal history of colonic polyps: Secondary | ICD-10-CM | POA: Diagnosis not present

## 2020-02-09 DIAGNOSIS — D123 Benign neoplasm of transverse colon: Secondary | ICD-10-CM

## 2020-02-09 DIAGNOSIS — Z1211 Encounter for screening for malignant neoplasm of colon: Secondary | ICD-10-CM | POA: Diagnosis not present

## 2020-02-09 MED ORDER — SODIUM CHLORIDE 0.9 % IV SOLN: 500.0000 mL | Freq: Once | INTRAVENOUS | Status: DC

## 2020-02-09 NOTE — Progress Notes (Signed)
VS-CW  Pt's states no medical or surgical changes since previsit or office visit.  

## 2020-02-09 NOTE — Op Note (Signed)
[facility] Patient Name: Emily Cowan Procedure Date: 02/09/2020 9:07 AM MRN: [id number] Endoscopist: Jerene Bears , MD Age: 74 Referring MD:  Date of Birth: 1946/03/05 Gender: Female Account #: [id number] Procedure:                Colonoscopy Indications:              High risk colon cancer surveillance: Personal                            history of sessile serrated colon polyp with                            dysplasia, Family history of colon cancer in a                            first-degree relative, Last colonoscopy: January                            2018 Medicines:                Monitored Anesthesia Care Procedure:                Pre-Anesthesia Assessment:                           - Prior to the procedure, a History and Physical                            was performed, and patient medications and                            allergies were reviewed. The patient's tolerance of                            previous anesthesia was also reviewed. The risks                            and benefits of the procedure and the sedation                            options and risks were discussed with the patient.                            All questions were answered, and informed consent                            was obtained. Prior Anticoagulants: The patient has                            taken no previous anticoagulant or antiplatelet                            agents. ASA Grade Assessment: II - A patient with  mild systemic disease. After reviewing the risks                            and benefits, the patient was deemed in                            satisfactory condition to undergo the procedure.                           After obtaining informed consent, the colonoscope                            was passed under direct vision. Throughout the                            procedure, the patient's blood pressure, pulse, and                             oxygen saturations were monitored continuously. The                            Colonoscope was introduced through the anus and                            advanced to the cecum, identified by appendiceal                            orifice and ileocecal valve. Scope In: 9:18:32 AM Scope Out: 9:39:05 AM Scope Withdrawal Time: 0 hours 15 minutes 35 seconds  Total Procedure Duration: 0 hours 20 minutes 33 seconds  Findings:                 The digital rectal exam was normal.                           A 15 mm polyp was found in the cecum. The polyp was                            flat. The polyp was removed with a piecemeal                            technique using a cold snare. Resection and                            retrieval were complete.                           A 4 mm polyp was found in the transverse colon. The                            polyp was sessile. The polyp was removed with a                            cold snare. Resection and retrieval  were complete.                           Multiple small-mouthed diverticula were found in                            the sigmoid colon.                           Internal hemorrhoids were found during                            retroflexion. The hemorrhoids were small. Complications:            No immediate complications. Estimated Blood Loss:     Estimated blood loss was minimal. Impression:               - One 15 mm polyp in the cecum, removed piecemeal                            using a cold snare. Resected and retrieved.                           - One 4 mm polyp in the transverse colon, removed                            with a cold snare. Resected and retrieved.                           - Diverticulosis in the sigmoid colon.                           - Small internal hemorrhoids. Recommendation:           - Patient has a contact number available for                            emergencies. The signs and symptoms of potential                             delayed complications were discussed with the                            patient. Return to normal activities tomorrow.                            Written discharge instructions were provided to the                            patient.                           - Resume previous diet.                           - Continue present medications.                           -  Await pathology results.                           - Repeat colonoscopy is recommended. The                            colonoscopy date will be determined after pathology                            results from today's exam become available for                            review. Jerene Bears, MD 02/09/2020 9:45:27 AM This report has been signed electronically.

## 2020-02-09 NOTE — Progress Notes (Signed)
To PACU, VSS. Report to [email or web address].tb 

## 2020-02-09 NOTE — Progress Notes (Signed)
Called to room to assist during endoscopic procedure.  Patient ID and intended procedure confirmed with present staff. Received instructions for my participation in the procedure from the performing physician.  

## 2020-02-09 NOTE — Patient Instructions (Signed)
Information on polyps, diverticuolsis and hemorrhoids given to you today.  Await pathology results.  Continue present medications and diet.  YOU HAD AN ENDOSCOPIC PROCEDURE TODAY AT [facility]:   Refer to the procedure report that was given to you for any specific questions about what was found during the examination.  If the procedure report does not answer your questions, please call your gastroenterologist to clarify.  If you requested that your care partner not be given the details of your procedure findings, then the procedure report has been included in a sealed envelope for you to review at your convenience later.  YOU SHOULD EXPECT: Some feelings of bloating in the abdomen. Passage of more gas than usual.  Walking can help get rid of the air that was put into your GI tract during the procedure and reduce the bloating. If you had a lower endoscopy (such as a colonoscopy or flexible sigmoidoscopy) you may notice spotting of blood in your stool or on the toilet paper. If you underwent a bowel prep for your procedure, you may not have a normal bowel movement for a few days.  Please Note:  You might notice some irritation and congestion in your nose or some drainage.  This is from the oxygen used during your procedure.  There is no need for concern and it should clear up in a day or so.  SYMPTOMS TO REPORT IMMEDIATELY:   Following lower endoscopy (colonoscopy or flexible sigmoidoscopy):  Excessive amounts of blood in the stool  Significant tenderness or worsening of abdominal pains  Swelling of the abdomen that is new, acute  Fever of 100F or higher   For urgent or emergent issues, a gastroenterologist can be reached at any hour by calling [phone]. Do not use MyChart messaging for urgent concerns.    DIET:  We do recommend a small meal at first, but then you may proceed to your regular diet.  Drink plenty of fluids but you should avoid alcoholic beverages for 24  hours.  ACTIVITY:  You should plan to take it easy for the rest of today and you should NOT DRIVE or use heavy machinery until tomorrow (because of the sedation medicines used during the test).    FOLLOW UP: Our staff will call the number listed on your records 48-72 hours following your procedure to check on you and address any questions or concerns that you may have regarding the information given to you following your procedure. If we do not reach you, we will leave a message.  We will attempt to reach you two times.  During this call, we will ask if you have developed any symptoms of COVID 19. If you develop any symptoms (ie: fever, flu-like symptoms, shortness of breath, cough etc.) before then, please call [phone].  If you test positive for Covid 19 in the 2 weeks post procedure, please call and report this information to [place].    If any biopsies were taken you will be contacted by phone or by letter within the next 1-3 weeks.  Please call us at [phone] if you have not heard about the biopsies in 3 weeks.    SIGNATURES/CONFIDENTIALITY: You and/or your care partner have signed paperwork which will be entered into your electronic medical record.  These signatures attest to the fact that that the information above on your After Visit Summary has been reviewed and is understood.  Full responsibility of the confidentiality of this discharge information lies with you  and/or your care-partner. 

## 2020-02-11 ENCOUNTER — Telehealth: Payer: Self-pay

## 2020-02-11 NOTE — Telephone Encounter (Signed)
  Follow up Call-  Call back number 02/09/2020  Post procedure Call Back phone  # [phone]  Permission to leave phone message Yes  Some recent data might be hidden     Patient questions:  Do you have a fever, pain , or abdominal swelling? No. Pain Score  0 *  Have you tolerated food without any problems? Yes.    Have you been able to return to your normal activities? Yes.    Do you have any questions about your discharge instructions: Diet   No. Medications  No. Follow up visit  No.  Do you have questions or concerns about your Care? No.  Actions: * If pain score is 4 or above: No action needed, pain <4.  1. Have you developed a fever since your procedure? no  2.   Have you had an respiratory symptoms (SOB or cough) since your procedure? no  3.   Have you tested positive for COVID 19 since your procedure no  4.   Have you had any family members/close contacts diagnosed with the COVID 19 since your procedure?  no   If yes to any of these questions please route to Joylene John, RN and Erenest Rasher, RN

## 2020-02-12 DIAGNOSIS — H26491 Other secondary cataract, right eye: Secondary | ICD-10-CM | POA: Diagnosis not present

## 2020-02-15 ENCOUNTER — Other Ambulatory Visit: Payer: Self-pay | Admitting: Family Medicine

## 2020-02-15 ENCOUNTER — Encounter: Payer: Self-pay | Admitting: Internal Medicine

## 2020-02-15 DIAGNOSIS — Z1231 Encounter for screening mammogram for malignant neoplasm of breast: Secondary | ICD-10-CM

## 2020-02-25 DIAGNOSIS — Z961 Presence of intraocular lens: Secondary | ICD-10-CM | POA: Diagnosis not present

## 2020-03-04 ENCOUNTER — Ambulatory Visit
Admission: RE | Admit: 2020-03-04 | Discharge: 2020-03-04 | Disposition: A | Discharge status: Home / Self Care | Payer: Medicare HMO | Source: Ambulatory Visit | Attending: Family Medicine | Admitting: Family Medicine

## 2020-03-04 ENCOUNTER — Other Ambulatory Visit: Payer: Self-pay | Admitting: Medical

## 2020-03-04 ENCOUNTER — Other Ambulatory Visit: Payer: Self-pay

## 2020-03-04 ENCOUNTER — Ambulatory Visit (INDEPENDENT_AMBULATORY_CARE_PROVIDER_SITE_OTHER): Payer: Medicare HMO | Admitting: Medical

## 2020-03-04 ENCOUNTER — Other Ambulatory Visit: Payer: Self-pay | Admitting: Family Medicine

## 2020-03-04 ENCOUNTER — Encounter: Payer: Self-pay | Admitting: Medical

## 2020-03-04 VITALS — BP 130/78 | HR 72 | Ht 68.0 in | Wt 162.0 lb

## 2020-03-04 DIAGNOSIS — Z7189 Other specified counseling: Secondary | ICD-10-CM

## 2020-03-04 DIAGNOSIS — Z1231 Encounter for screening mammogram for malignant neoplasm of breast: Secondary | ICD-10-CM | POA: Diagnosis not present

## 2020-03-04 DIAGNOSIS — M810 Age-related osteoporosis without current pathological fracture: Secondary | ICD-10-CM | POA: Diagnosis not present

## 2020-03-04 DIAGNOSIS — Z79899 Other long term (current) drug therapy: Secondary | ICD-10-CM | POA: Diagnosis not present

## 2020-03-04 DIAGNOSIS — G4700 Insomnia, unspecified: Secondary | ICD-10-CM

## 2020-03-04 DIAGNOSIS — H9190 Unspecified hearing loss, unspecified ear: Secondary | ICD-10-CM | POA: Diagnosis not present

## 2020-03-04 DIAGNOSIS — E559 Vitamin D deficiency, unspecified: Secondary | ICD-10-CM

## 2020-03-04 DIAGNOSIS — Z7185 Encounter for immunization safety counseling: Secondary | ICD-10-CM

## 2020-03-04 DIAGNOSIS — D369 Benign neoplasm, unspecified site: Secondary | ICD-10-CM

## 2020-03-04 MED ORDER — ZOLPIDEM TARTRATE 5 MG PO TABS: 5.0000 mg | ORAL_TABLET | Freq: Every evening | ORAL | 0 refills | Status: DC | PRN

## 2020-03-04 NOTE — Progress Notes (Signed)
Subjective: Chief Complaint  Patient presents with  . Medication Management   Here for med check.   Had laser eye surgery last month.  Had colonoscopy last month.  Saw ortho about shoulder a few months ago, had mammogram today.  Insomnia-she is having trouble sleeping as she has been out of her zolpidem.  She notes that she does well on zolpidem.  She used to be on 10 mg but we cut down to 5 due to safety concerns.  She would rather be on 10 mg of 5 does help.  She did not do well with trazodone in the past as it gave her unusual side effects.  We tried Belsomra at 1 point but she cannot afford due to with high co-pay.  She has tried over-the-counter remedies in the past that did not work very well.  She understands the risk and benefits of we have discussed this before regarding zolpidem.  [place] and there is not a lot of place for her to fall or trip over things.  She is careful when she comes out of bed in the morning.  She realizes her fall risk.  Osteoporosis-she continues on Boniva monthly.  She tolerates this better than Fosamax in the past.  She tries to get weightbearing and aerobic exercise  She tries to live stress free or low stress   Past Medical History:  Diagnosis Date  . Alcohol abuse   . Allergic rhinitis   . Allergy    seasonal  . Arthritis   . Cancer ([place]) 2002   BREAST  . Depression   . Family history of cancer    multiple  . Hearing impaired    wears hearing aids  . Hemangioma of liver 2005   MRI   . Osteoporosis   . Seborrheic dermatitis   . Vulvodynia    longstanding, failed Premarin cream; can't tolerate pelvic exams  . Wears glasses    Current Outpatient Medications on File Prior to Visit  Medication Sig Dispense Refill  . Ascorbic Acid (VITAMIN C) 500 MG CAPS     . calcium carbonate (OS-CAL) 600 MG TABS tablet Take 600 mg by mouth daily with breakfast.     . Cholecalciferol (VITAMIN D) 2000 units CAPS Take 2,000 capsules by mouth  1 day or 1 dose.    Marland Kitchen FLUoxetine (PROZAC) 40 MG capsule Take 1 capsule (40 mg total) by mouth daily. 90 capsule 0  . Glucosamine-Chondroit-Vit C-Mn (GLUCOSAMINE CHONDR 1500 COMPLX) CAPS Take 1 capsule by mouth daily.      Marland Kitchen ibandronate (BONIVA) 150 MG tablet TAKE 1 TABLET  EVERY  30  DAYS 3 tablet 2  . Multiple Vitamins-Minerals (MULTIVITAMIN WITH MINERALS) tablet Take 1 tablet by mouth daily.      Marland Kitchen omeprazole (PRILOSEC) 40 MG capsule TAKE 1 CAPSULE EVERY DAY 90 capsule 3  . rosuvastatin (CRESTOR) 10 MG tablet Take 1 tablet (10 mg total) by mouth at bedtime. 90 tablet 0  . HYDROcodone-acetaminophen (NORCO) 5-325 MG tablet Take 1 tablet by mouth 2 (two) times daily as needed for moderate pain. (Patient not taking: Reported on 01/27/2020) 30 tablet 0  . ibuprofen (ADVIL,MOTRIN) 800 MG tablet Take 1 tablet (800 mg total) by mouth every 8 (eight) hours as needed. (Patient not taking: Reported on 06/25/2019) 30 tablet 0   Current Facility-Administered Medications on File Prior to Visit  Medication Dose Route Frequency Provider Last Rate Last Admin  . 0.9 %  sodium chloride  infusion  500 mL Intravenous Continuous Pyrtle, Lajuan Lines, MD       ROS as in subjective     Objective: BP 130/78   Pulse 72   Ht 5\' 8"  (1.727 m)   Wt 162 lb (73.5 kg)   SpO2 95%   BMI 24.63 kg/m   Gen: wd, wn, nad Psych: pleasant, good eye contact, answers questions appropriately    Assessment: Encounter Diagnoses  Name Primary?  . Insomnia, unspecified type Yes  . Osteoporosis, unspecified osteoporosis type, unspecified pathological fracture presence   . Vitamin D deficiency   . Tubular adenoma   . Vaccine counseling   . Encounter for screening mammogram for malignant neoplasm of breast   . Hearing loss, unspecified hearing loss type, unspecified laterality   . High risk medication use        Plan: Insomnia -we discussed the risk and benefits of zolpidem.  We discussed fall risk.  She did not tolerate  trazodone.  She could not afford Belsomra, Ambien CR is not covered by insurance.  Thus we will stick with zolpidem 5 mg daily as she does well with this and wants to stay on this.  She is aware of the risk and benefits.  We also discussed sleep hygiene and cognitive behavioral therapy.  I encouraged her to pursue CBT  Osteoporosis-she will go ahead and get on the schedule to do bone density test.  At this point looking back in the chart records it appears that she has been on bisphosphonates for about 5 years.  She was initially on Fosamax but then we switched to Boniva at 1 point.   between the 2 medicines she should have been on both for about 5 years now.  We will likely take a drug holiday pending the bone density test  Vitamin D deficiency-continue supplement, vitamin D in the diet  Tubular adenoma- I reviewed her recent colonoscopy, 02/14/20 colonoscopy showing tubular adenoma.  She was advised by GI to repeat in 3 years   Screen for breast cancer - await mammogram from this morning  F/u in 6 months for medical well visit  Nakima was seen today for medication management.  Diagnoses and all orders for this visit:  Insomnia, unspecified type  Osteoporosis, unspecified osteoporosis type, unspecified pathological fracture presence  Vitamin D deficiency  Tubular adenoma  Vaccine counseling  Encounter for screening mammogram for malignant neoplasm of breast  Hearing loss, unspecified hearing loss type, unspecified laterality  High risk medication use  Other orders -     zolpidem (AMBIEN) 5 MG tablet; Take 1 tablet (5 mg total) by mouth at bedtime as needed. for sleep   F/u prn, 66mo for well visit

## 2020-03-04 NOTE — Patient Instructions (Addendum)
Recommendations:   Please go to [facility] for your Bone Density xray.   Their hours are 8 am - 4:30 pm Monday - Friday.  Take your insurance card with you.  [facility] [phone]  [address], [place], [place] [address]  [address], [place] [address]    Osteoporosis  You seem to have been on bisphosphonate therapy such as Fosamax or Boniva for about 5 years + now.      As long as your bone density test upcoming looks good, we would take a drug holiday and stop Boniva for the next 2 years and repeat bone density again at that time  Continue vitamin D therapy, weight bearing and aerobic exercise    Vaccines: Shingles vaccine:  I recommend you have a shingles vaccine to help prevent shingles or herpes zoster outbreak.   Please call your insurer to inquire about coverage for the Shingrix vaccine given in 2 doses.   Some insurers cover this vaccine after age 65, some cover this after age 20.  If your insurer covers this, then call to schedule appointment to have this vaccine here.    Insomnia   The standard recommendation for treatment is cognitive behavioral therapy which is done through a therapist  If you want to pursue that, we can make this referral or you can make your own appointment with a therapist for CBT therapy  I documentation shows that you did not tolerate trazodone, Belsomra was too expensive, your insurance would not cover the CR version of zolpidem, so you have requested to stay with low-dose zolpidem generic as this appears to be working.  We have discussed the risk and benefits of this medication.    Continue your other medicines as usual

## 2020-05-27 ENCOUNTER — Other Ambulatory Visit (INDEPENDENT_AMBULATORY_CARE_PROVIDER_SITE_OTHER): Payer: Medicare HMO

## 2020-05-27 ENCOUNTER — Other Ambulatory Visit: Payer: Self-pay

## 2020-05-27 ENCOUNTER — Ambulatory Visit
Admission: RE | Admit: 2020-05-27 | Discharge: 2020-05-27 | Disposition: A | Discharge status: Home / Self Care | Payer: Medicare HMO | Source: Ambulatory Visit | Attending: Medical | Admitting: Medical

## 2020-05-27 DIAGNOSIS — E559 Vitamin D deficiency, unspecified: Secondary | ICD-10-CM

## 2020-05-27 DIAGNOSIS — Z780 Asymptomatic menopausal state: Secondary | ICD-10-CM | POA: Diagnosis not present

## 2020-05-27 DIAGNOSIS — Z23 Encounter for immunization: Secondary | ICD-10-CM

## 2020-05-27 DIAGNOSIS — M810 Age-related osteoporosis without current pathological fracture: Secondary | ICD-10-CM

## 2020-05-27 DIAGNOSIS — E2839 Other primary ovarian failure: Secondary | ICD-10-CM

## 2020-06-09 ENCOUNTER — Other Ambulatory Visit: Payer: Self-pay | Admitting: Medical

## 2020-06-22 ENCOUNTER — Other Ambulatory Visit: Payer: Self-pay | Admitting: Medical

## 2020-06-23 ENCOUNTER — Other Ambulatory Visit: Payer: Self-pay | Admitting: Medical

## 2020-06-23 ENCOUNTER — Telehealth: Payer: Self-pay

## 2020-06-23 MED ORDER — ZOLPIDEM TARTRATE 5 MG PO TABS: 5.0000 mg | ORAL_TABLET | Freq: Every evening | ORAL | 0 refills | Status: DC | PRN

## 2020-06-23 NOTE — Telephone Encounter (Signed)
Received fax from [facility] for a refill on the pts. Zolpidem and pt. Last apt was 03/04/20.

## 2020-08-05 ENCOUNTER — Other Ambulatory Visit: Payer: Self-pay | Admitting: Medical

## 2020-08-22 ENCOUNTER — Other Ambulatory Visit: Payer: Self-pay | Admitting: Medical

## 2020-09-21 ENCOUNTER — Ambulatory Visit: Payer: Self-pay | Admitting: Medical

## 2020-09-27 ENCOUNTER — Other Ambulatory Visit: Payer: Self-pay | Admitting: Medical

## 2020-09-27 MED ORDER — ZOLPIDEM TARTRATE 5 MG PO TABS: 5.0000 mg | ORAL_TABLET | Freq: Every evening | ORAL | 0 refills | Status: DC | PRN

## 2020-10-05 ENCOUNTER — Ambulatory Visit (INDEPENDENT_AMBULATORY_CARE_PROVIDER_SITE_OTHER): Payer: Medicare HMO | Admitting: Medical

## 2020-10-05 ENCOUNTER — Other Ambulatory Visit: Payer: Self-pay

## 2020-10-05 ENCOUNTER — Encounter: Payer: Self-pay | Admitting: Medical

## 2020-10-05 VITALS — BP 126/82 | HR 77 | Ht 68.0 in | Wt 165.2 lb

## 2020-10-05 DIAGNOSIS — E782 Mixed hyperlipidemia: Secondary | ICD-10-CM

## 2020-10-05 DIAGNOSIS — Z79899 Other long term (current) drug therapy: Secondary | ICD-10-CM

## 2020-10-05 DIAGNOSIS — K529 Noninfective gastroenteritis and colitis, unspecified: Secondary | ICD-10-CM | POA: Diagnosis not present

## 2020-10-05 DIAGNOSIS — M5030 Other cervical disc degeneration, unspecified cervical region: Secondary | ICD-10-CM

## 2020-10-05 DIAGNOSIS — M25511 Pain in right shoulder: Secondary | ICD-10-CM | POA: Diagnosis not present

## 2020-10-05 DIAGNOSIS — Z136 Encounter for screening for cardiovascular disorders: Secondary | ICD-10-CM | POA: Diagnosis not present

## 2020-10-05 DIAGNOSIS — Z7189 Other specified counseling: Secondary | ICD-10-CM

## 2020-10-05 DIAGNOSIS — E559 Vitamin D deficiency, unspecified: Secondary | ICD-10-CM

## 2020-10-05 DIAGNOSIS — E2839 Other primary ovarian failure: Secondary | ICD-10-CM

## 2020-10-05 DIAGNOSIS — K219 Gastro-esophageal reflux disease without esophagitis: Secondary | ICD-10-CM | POA: Diagnosis not present

## 2020-10-05 DIAGNOSIS — M810 Age-related osteoporosis without current pathological fracture: Secondary | ICD-10-CM

## 2020-10-05 DIAGNOSIS — N94819 Vulvodynia, unspecified: Secondary | ICD-10-CM

## 2020-10-05 DIAGNOSIS — D369 Benign neoplasm, unspecified site: Secondary | ICD-10-CM

## 2020-10-05 DIAGNOSIS — M5416 Radiculopathy, lumbar region: Secondary | ICD-10-CM

## 2020-10-05 DIAGNOSIS — H9190 Unspecified hearing loss, unspecified ear: Secondary | ICD-10-CM

## 2020-10-05 DIAGNOSIS — Z0000 Encounter for general adult medical examination without abnormal findings: Secondary | ICD-10-CM | POA: Diagnosis not present

## 2020-10-05 DIAGNOSIS — G4700 Insomnia, unspecified: Secondary | ICD-10-CM

## 2020-10-05 DIAGNOSIS — J301 Allergic rhinitis due to pollen: Secondary | ICD-10-CM

## 2020-10-05 DIAGNOSIS — G8929 Other chronic pain: Secondary | ICD-10-CM

## 2020-10-05 DIAGNOSIS — Z853 Personal history of malignant neoplasm of breast: Secondary | ICD-10-CM

## 2020-10-05 DIAGNOSIS — Z7185 Encounter for immunization safety counseling: Secondary | ICD-10-CM

## 2020-10-05 DIAGNOSIS — Z974 Presence of external hearing-aid: Secondary | ICD-10-CM

## 2020-10-05 DIAGNOSIS — M542 Cervicalgia: Secondary | ICD-10-CM

## 2020-10-05 DIAGNOSIS — F341 Dysthymic disorder: Secondary | ICD-10-CM | POA: Diagnosis not present

## 2020-10-05 DIAGNOSIS — L989 Disorder of the skin and subcutaneous tissue, unspecified: Secondary | ICD-10-CM

## 2020-10-05 LAB — COMPREHENSIVE METABOLIC PANEL
ALT: 20 IU/L (ref 0–32)
AST: 18 IU/L (ref 0–40)
Albumin/Globulin Ratio: 2.2 (ref 1.2–2.2)
Albumin: 4.6 g/dL (ref 3.7–4.7)
Alkaline Phosphatase: 68 IU/L (ref 44–121)
BUN/Creatinine Ratio: 19 (ref 12–28)
BUN: 13 mg/dL (ref 8–27)
Bilirubin Total: 0.4 mg/dL (ref 0.0–1.2)
CO2: 21 mmol/L (ref 20–29)
Calcium: 9.1 mg/dL (ref 8.7–10.3)
Chloride: 105 mmol/L (ref 96–106)
Creatinine, Ser: 0.7 mg/dL (ref 0.57–1.00)
GFR calc Af Amer: 99 mL/min/{1.73_m2} (ref >59)
GFR calc non Af Amer: 86 mL/min/{1.73_m2} (ref >59)
Globulin, Total: 2.1 g/dL (ref 1.5–4.5)
Glucose: 87 mg/dL (ref 65–99)
Potassium: 4.5 mmol/L (ref 3.5–5.2)
Sodium: 141 mmol/L (ref 134–144)
Total Protein: 6.7 g/dL (ref 6.0–8.5)

## 2020-10-05 LAB — CBC WITH DIFFERENTIAL/PLATELET
Basophils Absolute: 0.1 10*3/uL (ref 0.0–0.2)
Basos: 1 %
EOS (ABSOLUTE): 0.1 10*3/uL (ref 0.0–0.4)
Eos: 1 %
Hematocrit: 39 % (ref 34.0–46.6)
Hemoglobin: 13.1 g/dL (ref 11.1–15.9)
Immature Grans (Abs): 0 10*3/uL (ref 0.0–0.1)
Immature Granulocytes: 0 %
Lymphocytes Absolute: 1.4 10*3/uL (ref 0.7–3.1)
Lymphs: 23 %
MCH: 31.8 pg (ref 26.6–33.0)
MCHC: 33.6 g/dL (ref 31.5–35.7)
MCV: 95 fL (ref 79–97)
Monocytes Absolute: 0.7 10*3/uL (ref 0.1–0.9)
Monocytes: 12 %
Neutrophils Absolute: 3.9 10*3/uL (ref 1.4–7.0)
Neutrophils: 63 %
Platelets: 273 10*3/uL (ref 150–450)
RBC: 4.12 x10E6/uL (ref 3.77–5.28)
RDW: 12.4 % (ref 11.7–15.4)
WBC: 6.1 10*3/uL (ref 3.4–10.8)

## 2020-10-05 LAB — LIPID PANEL
Chol/HDL Ratio: 2.7 ratio (ref 0.0–4.4)
Cholesterol, Total: 164 mg/dL (ref 100–199)
HDL: 61 mg/dL (ref >39)
LDL Chol Calc (NIH): 84 mg/dL (ref 0–99)
Triglycerides: 106 mg/dL (ref 0–149)
VLDL Cholesterol Cal: 19 mg/dL (ref 5–40)

## 2020-10-05 NOTE — Patient Instructions (Signed)
Today you had a preventative care visit or wellness visit.    Topics today may have included healthy lifestyle, diet, exercise, preventative care, vaccinations, sick and well care, proper use of emergency dept and after hours care, as well as other concerns.     Recommendations: Continue to return yearly for your annual wellness and preventative care visits.  This gives [place] a chance to discuss healthy lifestyle, exercise, vaccinations, review your chart record, and perform screenings where appropriate.  I recommend you see your eye doctor yearly for routine vision care.  I recommend you see your dentist yearly for routine dental care including hygiene visits twice yearly.   Vaccination recommendations were reviewed  You are due for tetanus booster, but insurance typically doesn't cover this.  Consider booster at pharmacy or health department where it will be cheaper  Continue plan to get 2nd Shingrix next week  Return in a few week for Pneumococcal 23 vaccine booster.  You are up to date on other vaccines   Screening for cancer: Breast cancer screening: We reviewed recommendations for regular mammograms and breast cancer screening. USPSTF recommends against screening beyond age 75yo  Colon cancer screening:  I reviewed your colonoscopy on file that is up to date from 2021  Skin cancer screening: Check your skin regularly for new changes, growing lesions, or other lesions of concern Come in for evaluation if you have skin lesions of concern.  It may be worthwhile for doing a dermatology consult.  Let me know if you want to do this.  Lung cancer screening: If you have a greater than 30 pack year history of tobacco use, then you qualify for lung cancer screening with a chest CT scan  We currently don't have screenings for other cancers besides breast, cervical, colon, and lung cancers.  If you have a strong family history of cancer or have other cancer screening concerns, please let  me know.    Bone health: Get at least 150 minutes of aerobic exercise weekly Get weight bearing exercise at least once weekly  Osteoporosis -plan to repeat bone density test and fall 2023.  I will research your prior medication usage.  Once you have been on this phosphate likely this for 5 years we stop the medicine for a while for a drug holiday.  I do recommend weightbearing exercise at least twice weekly, walking or other aerobic exercise regularly   Heart health: Get at least 150 minutes of aerobic exercise weekly Limit alcohol It is important to maintain a healthy blood pressure and healthy cholesterol numbers  We updated your EKG screen today  Consider a coronary CT calcium score test for further evaluate the heart.  This is being offered for $99 currently.   Alternately If you want other screening we can refer you to heart doctor   Separate significant issues discussed: Hyperlipidemia-continue statin medication, eat a low-cholesterol diet  Acid reflux-I recommend either half tablet of omeprazole or over-the-counter Pepcid if you can tolerate this.  Long-term risk of omeprazole use is worsening bone density  Insomnia-you seem to do okay on the lower dose Ambien we will continue this as you requested.  Continue your Prozac as this seems to be doing okay.    Continue vitamin D supplement daily.  I recommend 2000 units daily.  You indicate that you have completed a Health Care Power of Attorney and Living Will.   Make sure we have a copy for our records.

## 2020-10-05 NOTE — Progress Notes (Signed)
Subjective:    Emily Cowan is a 75 y.o. female who presents for Preventative Services visit and chronic medical problems/med check visit.    Primary Care Provider Krystofer Hevener, Camelia Eng, PA-C here for primary care  Current Health Care Team:  Dentist, n/a   Eye doctor, Dr. Jonn Shingles [facility] care)  Dr. Zenovia Jarred  Medical Services you may have received from other than [facility] providers in the past year (date may be approximate) none  Exercise Current exercise habits: The patient does not participate in regular exercise at present.   Nutrition/Diet Current diet: in general, a "healthy" diet    Depression Screen Depression screen [facility] 2/9 10/05/2020  Decreased Interest 0  Down, Depressed, Hopeless 0  PHQ - 2 Score 0  Altered sleeping -  Tired, decreased energy -  Change in appetite -  Feeling bad or failure about yourself  -  Trouble concentrating -  Moving slowly or fidgety/restless -  Suicidal thoughts -  PHQ-9 Score -    Activities of Daily Living Screen/Functional Status Survey Is the patient deaf or have difficulty hearing?: Yes Does the patient have difficulty seeing, even when wearing glasses/contacts?: No Does the patient have difficulty concentrating, remembering, or making decisions?: No Does the patient have difficulty walking or climbing stairs?: No Does the patient have difficulty dressing or bathing?: No Does the patient have difficulty doing errands alone such as visiting a doctor's office or shopping?: No  Can patient draw a clock face showing 3:00 oclock,yes  [place]  10/05/2020 06/23/2018 06/20/2017 06/19/2016 06/01/2015  Falls in the past year? 0 No No Yes No  Number falls in past yr: - - - 1 -  Injury with Fall? - - - Yes -  Risk for fall due to : No Fall Risks - - Impaired balance/gait -  Follow up Falls evaluation completed - - - -    Gait Assessment: Normal gait observed: yes  Advanced directives Does patient have a  [place]? Yes Does patient have a Living Will? Yes  Past Medical History:  Diagnosis Date  . Alcohol abuse   . Allergic rhinitis   . Allergy    seasonal  . Arthritis   . Cancer ([place]) 2002   BREAST  . Depression   . Family history of cancer    multiple  . Hearing impaired    wears hearing aids  . Hemangioma of liver 2005   MRI   . Osteoporosis   . Seborrheic dermatitis   . Vulvodynia    longstanding, failed Premarin cream; can't tolerate pelvic exams  . Wears glasses     Past Surgical History:  Procedure Laterality Date  . cataract surgery    . COLONOSCOPY  2011   in [facility]  . COLONOSCOPY  2018  . MASTECTOMY  2002   right  . TONSILLECTOMY    . WISDOM TOOTH EXTRACTION      Social History   Socioeconomic History  . Marital status: Single    Spouse name: Not on file  . Number of children: Not on file  . Years of education: Not on file  . Highest education level: Not on file  Occupational History  . Not on file  Tobacco Use  . Smoking status: Former Smoker    Quit date: 1992    Years since quitting: 30.1  . Smokeless tobacco: Never Used  Vaping Use  . Vaping Use: Never used  Substance and Sexual  Activity  . Alcohol use: Yes    Alcohol/week: 5.0 - 6.0 standard drinks    Types: 5 - 6 Glasses of wine per week  . Drug use: No  . Sexual activity: Not Currently  Other Topics Concern  . Not on file  Social History Narrative   Single, no children, retired, lesbian but no current relationship; exercise- working in the yard, cutting down trees, Biomedical scientist, walking the dogs.  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 Cancer Mother         GI cancer  . Cancer Father        liver  . Stroke Father   . Cancer Sister        vulvar  . Rectal cancer Sister 74  . Cancer Brother        liver  . Colon polyps Brother 89  . Cancer Brother        lung  . Diabetes Neg Hx   . Heart disease Neg Hx   . Hypertension Neg Hx   . Hyperlipidemia Neg Hx   . Breast cancer Neg Hx   . Hemochromatosis Neg Hx   . Stomach cancer Neg Hx   . Esophageal cancer Neg Hx      Current Outpatient Medications:  .  Ascorbic Acid (VITAMIN C) 500 MG CAPS, , Disp: , Rfl:  .  FLUoxetine (PROZAC) 40 MG capsule, TAKE 1 CAPSULE EVERY DAY, Disp: 90 capsule, Rfl: 1 .  Glucosamine-Chondroit-Vit C-Mn (GLUCOSAMINE CHONDR 1500 COMPLX) CAPS, Take 1 capsule by mouth daily., Disp: , Rfl:  .  ibandronate (BONIVA) 150 MG tablet, TAKE 1 TABLET  EVERY  30  DAYS, Disp: 3 tablet, Rfl: 2 .  Multiple Vitamins-Minerals (MULTIVITAMIN WITH MINERALS) tablet, Take 1 tablet by mouth daily.  , Disp: , Rfl:  .  omeprazole (PRILOSEC) 40 MG capsule, TAKE 1 CAPSULE EVERY DAY, Disp: 90 capsule, Rfl: 0 .  rosuvastatin (CRESTOR) 10 MG tablet, TAKE 1 TABLET AT BEDTIME, Disp: 90 tablet, Rfl: 0 .  zolpidem (AMBIEN) 5 MG tablet, Take 1 tablet (5 mg total) by mouth at bedtime as needed. for sleep, Disp: 90 tablet, Rfl: 0 .  HYDROcodone-acetaminophen (NORCO) 5-325 MG tablet, Take 1 tablet by mouth 2 (two) times daily as needed for moderate pain. (Patient not taking: No sig reported), Disp: 30 tablet, Rfl: 0 .  ibuprofen (ADVIL,MOTRIN) 800 MG tablet, Take 1 tablet (800 mg total) by mouth every 8 (eight) hours as needed. (Patient not taking: No sig reported), Disp: 30 tablet, Rfl: 0  Current Facility-Administered Medications:  .  0.9 %  sodium chloride infusion, 500 mL, Intravenous, Continuous, Pyrtle, Lajuan Lines, MD  No Known Allergies  History reviewed: allergies, current medications, past family history, past medical history, past social history, past surgical history and problem  list  Chronic issues discussed: Osteoporosis-compliant with Boniva monthly, not during exercise currently  Hyperlipidemia-compliant with Crestor 10 mg daily without complaint  Mood has been okay, no particular issues, compliant with Prozac 40 mg daily  Vitamin D deficiency-compliant vitamin D supplement and vitamin    Acute issues discussed: none  Objective:      Biometrics  BP 126/82   Pulse 77   Ht 5\' 8"  (1.727 m)   Wt 165 lb 3.2 oz (74.9 kg)   SpO2 97%   BMI 25.12 kg/m   Cognitive Testing  Alert? Yes  Normal Appearance?Yes  Oriented to person? Yes  Place? Yes   Time? Yes  Recall of three objects?  Yes  Can perform simple calculations? Yes  Displays appropriate judgment?Yes  Can read the correct time from a watch face?Yes  General appearance: alert, no distress, WD/WN, white female  Nutritional Status: Inadequate calore intake? no Loss of muscle mass? no Loss of fat beneath skin? no Localized or general edema? no Diminished functional status? no  Other pertinent exam: HEENT: normocephalic, sclerae anicteric, TMs pearly, nares patent, no discharge or erythema, pharynx normal Oral cavity: MMM, no lesions Neck: supple, no lymphadenopathy, no thyromegaly, no masses, no bruits Heart: RRR, normal S1, S2, no murmurs Lungs: CTA bilaterally, no wheezes, rhonchi, or rales Abdomen: +bs, soft, non tender, non distended, no masses, no hepatomegaly, no splenomegaly Musculoskeletal: nontender, no swelling, no obvious deformity Extremities: no edema, no cyanosis, no clubbing Pulses: 2+ symmetric, upper and lower extremities, normal cap refill Neurological: alert, oriented x 3, CN2-12 intact, strength normal upper extremities and lower extremities, sensation normal throughout, DTRs 2+ throughout, no cerebellar signs, gait normal Psychiatric: normal affect, behavior normal, pleasant  Declines breast/pelvic exam Skin: freckles, no worrisome lesions but mostly  clothed   EKG Indication screen for heart disease, rate 65 bpm, PR 138 ms, QRS 84 ms, QTC 453 ms, axis 48 degrees, normal sinus rhythm    Assessment:   Encounter Diagnoses  Name Primary?  . Encounter for health maintenance examination in adult Yes  . Medicare annual wellness visit, subsequent   . Osteoporosis, unspecified osteoporosis type, unspecified pathological fracture presence   . Chronic neck pain   . Chronic right shoulder pain   . Chronic diarrhea   . DDD (degenerative disc disease), cervical   . Dysthymia   . Estrogen deficiency   . Gastroesophageal reflux disease without esophagitis   . Hearing loss, unspecified hearing loss type, unspecified laterality   . High risk medication use   . Vitamin D deficiency   . Vaccine counseling   . Tubular adenoma   . Skin lesions   . Vulvodynia   . Wears hearing aid   . Lumbar back pain with radiculopathy affecting right lower extremity   . Insomnia, unspecified type   . History of breast cancer in female   . Mixed hyperlipidemia   . Allergic rhinitis due to pollen, unspecified seasonality   . Advanced directives, counseling/discussion   . Screening for heart disease      Plan:   A preventative services visit was completed today.  During the course of the visit today, we discussed and counseled about appropriate screening and preventive services.  A health risk assessment was established today that included a review of current medications, allergies, social history, family history, medical and preventative health history, biometrics, and preventative screenings to identify potential safety concerns or impairments.  Today you had a preventative care visit or wellness visit.    Topics today may have included healthy lifestyle, diet, exercise, preventative care, vaccinations, sick and well care, proper use of emergency dept and after hours care, as well as other concerns.     Recommendations: Continue to return yearly for  your annual wellness and preventative care visits.  This gives [place] a chance to discuss healthy lifestyle, exercise, vaccinations,  review your chart record, and perform screenings where appropriate.  I recommend you see your eye doctor yearly for routine vision care.  I recommend you see your dentist yearly for routine dental care including hygiene visits twice yearly.   Vaccination recommendations were reviewed  You are due for tetanus booster, but insurance typically doesn't cover this.  Consider booster at pharmacy or health department where it will be cheaper  Continue plan to get 2nd Shingrix next week  Return in a few week for Pneumococcal 23 vaccine booster.  You are up to date on other vaccines   Screening for cancer: Breast cancer screening: We reviewed recommendations for regular mammograms and breast cancer screening. USPSTF recommends against screening beyond age 71yo  Colon cancer screening:  I reviewed your colonoscopy on file that is up to date from 2021  Skin cancer screening: Check your skin regularly for new changes, growing lesions, or other lesions of concern Come in for evaluation if you have skin lesions of concern.  It may be worthwhile for doing a dermatology consult.  Let me know if you want to do this.  Lung cancer screening: If you have a greater than 30 pack year history of tobacco use, then you qualify for lung cancer screening with a chest CT scan  We currently don't have screenings for other cancers besides breast, cervical, colon, and lung cancers.  If you have a strong family history of cancer or have other cancer screening concerns, please let me know.    Bone health: Get at least 150 minutes of aerobic exercise weekly Get weight bearing exercise at least once weekly  Osteoporosis -plan to repeat bone density test and fall 2023.  I will research your prior medication usage.  Once you have been on this phosphate likely this for 5 years we stop  the medicine for a while for a drug holiday.  I do recommend weightbearing exercise at least twice weekly, walking or other aerobic exercise regularly   Heart health: Get at least 150 minutes of aerobic exercise weekly Limit alcohol It is important to maintain a healthy blood pressure and healthy cholesterol numbers  We updated your EKG screen today  Consider a coronary CT calcium score test for further evaluate the heart.  This is being offered for $99 currently.   Alternately If you want other screening we can refer you to heart doctor   Separate significant issues discussed: Hyperlipidemia-continue statin medication, eat a low-cholesterol diet  Acid reflux-I recommend either half tablet of omeprazole or over-the-counter Pepcid if you can tolerate this.  Long-term risk of omeprazole use is worsening bone density  Insomnia-you seem to do okay on the lower dose Ambien we will continue this as you requested.  Continue your Prozac as this seems to be doing okay.    Continue vitamin D supplement daily.  I recommend 2000 units daily.  You indicate that you have completed a Health Care Power of Attorney and Living Will.   Make sure we have a copy for our records.   Janaiah was seen today for medicare wellness.  Diagnoses and all orders for this visit:  Encounter for health maintenance examination in adult -     EKG 12-Lead -     Comprehensive metabolic panel -     CBC with Differential/Platelet -     Lipid panel  Medicare annual wellness visit, subsequent  Osteoporosis, unspecified osteoporosis type, unspecified pathological fracture presence  Chronic neck pain  Chronic right shoulder pain  Chronic diarrhea  DDD (degenerative disc disease), cervical  Dysthymia  Estrogen deficiency  Gastroesophageal reflux disease without esophagitis  Hearing loss, unspecified hearing loss type, unspecified laterality  High risk medication use  Vitamin D deficiency  Vaccine  counseling  Tubular adenoma  Skin lesions  Vulvodynia  Wears hearing aid  Lumbar back pain with radiculopathy affecting right lower extremity  Insomnia, unspecified type  History of breast cancer in female  Mixed hyperlipidemia -     EKG 12-Lead -     Comprehensive metabolic panel -     Lipid panel  Allergic rhinitis due to pollen, unspecified seasonality  Advanced directives, counseling/discussion  Screening for heart disease -     EKG 12-Lead     Follow-up pending labs, yearly for physical         Medicare Attestation A preventative services visit was completed today.  During the course of the visit the patient was educated and counseled about appropriate screening and preventive services.  A health risk assessment was established with the patient that included a review of current medications, allergies, social history, family history, medical and preventative health history, biometrics, and preventative screenings to identify potential safety concerns or impairments.  A personalized plan was printed today for the patient's records and use.   Personalized health advice and education was given today to reduce health risks and promote self management and wellness.  Information regarding end of life planning was discussed today.  Dorothea Ogle, PA-C   10/05/2020

## 2020-10-06 ENCOUNTER — Other Ambulatory Visit: Payer: Self-pay | Admitting: Medical

## 2020-10-06 MED ORDER — ROSUVASTATIN CALCIUM 10 MG PO TABS: 10.0000 mg | ORAL_TABLET | Freq: Every day | ORAL | 3 refills | Status: DC

## 2020-10-07 ENCOUNTER — Telehealth: Payer: Self-pay | Admitting: Medical

## 2020-10-07 NOTE — Telephone Encounter (Signed)
Emily Cowan I would like to see if we can get her approved for Evenity monthly injection.  She has osteoporosis.  She has failed Boniva and Fosamax.  She was on Fosamax from 2015 through 2018.  2018 to now has been on Boniva.  Her bone density scan does not show much improvement so we will consider this a failure of treatment

## 2020-10-14 ENCOUNTER — Telehealth: Payer: Self-pay | Admitting: Medical

## 2020-10-14 NOTE — Telephone Encounter (Signed)
Spoke to the rep for Evenity. Due to pt's insurance medication will cost pt $425.00 dollars a month. No grants available.

## 2020-10-14 NOTE — Telephone Encounter (Signed)
I have left a message for Emily Cowan to call me back. We do not have any patients on this so I will need to figure out the process.

## 2020-10-17 NOTE — Telephone Encounter (Signed)
Please call her and see if she would be agreeable to the Prolia.  That price seems reasonable for the type of medicine  I feel like her current medicine is not working effectively enough

## 2020-10-17 NOTE — Telephone Encounter (Signed)
Will start prolia approval process

## 2020-10-17 NOTE — Telephone Encounter (Signed)
Prolia with be around $280 every 6 months.

## 2020-10-17 NOTE — Telephone Encounter (Signed)
Ok, in that case, how about Prolia?   Its funny, the patients that need this stuff such as this one, often can't get it due to cost - contrary to what the drug reps push

## 2020-10-18 ENCOUNTER — Other Ambulatory Visit: Payer: Self-pay | Admitting: Family Medicine

## 2020-11-04 ENCOUNTER — Other Ambulatory Visit: Payer: Self-pay | Admitting: Medical

## 2020-11-08 ENCOUNTER — Telehealth: Payer: Self-pay | Admitting: Medical

## 2020-11-08 NOTE — Telephone Encounter (Signed)
Called pt concerning Prolia. Informed pt that I had gotten Prolia approved. Estimated cost is $273.00. PT states she wants to think about the cost and will call be back

## 2020-11-08 NOTE — Telephone Encounter (Signed)
noted 

## 2020-11-17 ENCOUNTER — Other Ambulatory Visit: Payer: Self-pay

## 2020-11-17 DIAGNOSIS — M810 Age-related osteoporosis without current pathological fracture: Secondary | ICD-10-CM

## 2020-11-24 ENCOUNTER — Telehealth: Payer: Self-pay | Admitting: Medical

## 2020-11-24 NOTE — Telephone Encounter (Signed)
See patient email.  That is fine to wait til then.     Lets try to get Prolia

## 2020-12-05 ENCOUNTER — Telehealth: Payer: Self-pay | Admitting: Medical

## 2020-12-05 NOTE — Telephone Encounter (Signed)
See Melissa's note.   I think I already sent to [facility] prior

## 2020-12-05 NOTE — Telephone Encounter (Signed)
Pt has decided that she does want to do Prolia I advised patient that I would make you aware and then you would send to [facility] to contact her

## 2020-12-08 ENCOUNTER — Other Ambulatory Visit: Payer: Medicare HMO

## 2020-12-08 ENCOUNTER — Other Ambulatory Visit: Payer: Self-pay

## 2020-12-08 DIAGNOSIS — M810 Age-related osteoporosis without current pathological fracture: Secondary | ICD-10-CM | POA: Diagnosis not present

## 2020-12-08 MED ORDER — DENOSUMAB 60 MG/ML ~~LOC~~ SOSY
60.0000 mg | PREFILLED_SYRINGE | Freq: Once | SUBCUTANEOUS | Status: AC
  Administered 2020-12-08: 60 mg via SUBCUTANEOUS

## 2020-12-10 ENCOUNTER — Other Ambulatory Visit: Payer: Self-pay | Admitting: Medical

## 2020-12-12 NOTE — Telephone Encounter (Signed)
Is this okay to refill? 

## 2020-12-31 ENCOUNTER — Other Ambulatory Visit: Payer: Self-pay | Admitting: Medical

## 2021-01-18 ENCOUNTER — Other Ambulatory Visit: Payer: Self-pay

## 2021-01-18 ENCOUNTER — Ambulatory Visit (INDEPENDENT_AMBULATORY_CARE_PROVIDER_SITE_OTHER): Payer: Medicare HMO | Admitting: Medical

## 2021-01-18 ENCOUNTER — Encounter: Payer: Self-pay | Admitting: Medical

## 2021-01-18 VITALS — BP 126/78 | HR 70 | Ht 68.0 in | Wt 160.8 lb

## 2021-01-18 DIAGNOSIS — Z79899 Other long term (current) drug therapy: Secondary | ICD-10-CM | POA: Diagnosis not present

## 2021-01-18 DIAGNOSIS — S60512A Abrasion of left hand, initial encounter: Secondary | ICD-10-CM | POA: Diagnosis not present

## 2021-01-18 DIAGNOSIS — H9190 Unspecified hearing loss, unspecified ear: Secondary | ICD-10-CM | POA: Diagnosis not present

## 2021-01-18 DIAGNOSIS — Z7289 Other problems related to lifestyle: Secondary | ICD-10-CM

## 2021-01-18 DIAGNOSIS — F5104 Psychophysiologic insomnia: Secondary | ICD-10-CM

## 2021-01-18 DIAGNOSIS — F1090 Alcohol use, unspecified, uncomplicated: Secondary | ICD-10-CM | POA: Insufficient documentation

## 2021-01-18 DIAGNOSIS — R251 Tremor, unspecified: Secondary | ICD-10-CM | POA: Diagnosis not present

## 2021-01-18 DIAGNOSIS — Z789 Other specified health status: Secondary | ICD-10-CM | POA: Insufficient documentation

## 2021-01-18 NOTE — Progress Notes (Signed)
Subjective:  Emily Cowan is a 75 y.o. female who presents for Chief Complaint  Patient presents with  . Tremors  . Abrasion    Left hand from dog this morning      Here for concern about tremors.   First noticed this 6- 9 months ago but lately others notice.  Drinking water its noticeable, keeping peas on a fork is quite challenging.   No tremor at rest.  Denies falls, no incontinence but sometimes can feel a little off balance walking.  She researched this online.  She was curious about the various causes listed online.  She does realize that alcohol can play a role.  She typically drinks about 2 glasses of wine per night long-term.  She lives alone  She has chronic insomnia.  She continues on Ambien regularly.  Sleep is good til about 4am, but then she wakes up . Takes Ambien daily , chronic use.  No recent falls, no confusion, no slurred speech, no visual change.  She wears hearing aids, chronic hearing loss  She also has an abrasion of her left hand, scraped by her dog this morning.  She would like to get a tetanus update  No other aggravating or relieving factors.    No other c/o.  Past Medical History:  Diagnosis Date  . Alcohol abuse   . Allergic rhinitis   . Allergy    seasonal  . Arthritis   . Cancer ([place]) 2002   BREAST  . Depression   . Family history of cancer    multiple  . Hearing impaired    wears hearing aids  . Hemangioma of liver 2005   MRI   . Osteoporosis   . Seborrheic dermatitis   . Vulvodynia    longstanding, failed Premarin cream; can't tolerate pelvic exams  . Wears glasses    Current Outpatient Medications on File Prior to Visit  Medication Sig Dispense Refill  . Ascorbic Acid (VITAMIN C) 500 MG CAPS     . FLUoxetine (PROZAC) 40 MG capsule TAKE 1 CAPSULE EVERY DAY 90 capsule 1  . Glucosamine-Chondroit-Vit C-Mn (GLUCOSAMINE CHONDR 1500 COMPLX) CAPS Take 1 capsule by mouth daily.    . Multiple Vitamins-Minerals (MULTIVITAMIN WITH  MINERALS) tablet Take 1 tablet by mouth daily.      Marland Kitchen omeprazole (PRILOSEC) 40 MG capsule TAKE 1 CAPSULE EVERY DAY 90 capsule 0  . rosuvastatin (CRESTOR) 10 MG tablet Take 1 tablet (10 mg total) by mouth at bedtime. 90 tablet 3  . zolpidem (AMBIEN) 5 MG tablet TAKE 1 TABLET AT BEDTIME AS NEEDED FOR SLEEP 90 tablet 0  . HYDROcodone-acetaminophen (NORCO) 5-325 MG tablet Take 1 tablet by mouth 2 (two) times daily as needed for moderate pain. (Patient not taking: No sig reported) 30 tablet 0   Current Facility-Administered Medications on File Prior to Visit  Medication Dose Route Frequency Provider Last Rate Last Admin  . 0.9 %  sodium chloride infusion  500 mL Intravenous Continuous Pyrtle, Lajuan Lines, MD         The following portions of the patient's history were reviewed and updated as appropriate: allergies, current medications, past family history, past medical history, past social history, past surgical history and problem list.  ROS Otherwise as in subjective above  Objective: BP 126/78   Pulse 70   Ht 5\' 8"  (1.727 m)   Wt 160 lb 12.8 oz (72.9 kg)   SpO2 97%   BMI 24.45 kg/m   General appearance: alert,  no distress, well developed, well nourished, hard of hearing, hearing aids present HEENT: normocephalic, sclerae anicteric, conjunctiva pink and moist, TMs pearly, nares patent, no discharge or erythema, pharynx normal Oral cavity: MMM, no lesions Neck: supple, no lymphadenopathy, no thyromegaly, no masses no bruits Heart: RRR, normal S1, S2, no murmurs Lungs: CTA bilaterally, no wheezes, rhonchi, or rales Pulses: 2+ radial pulses, 2+ pedal pulses, normal cap refill Ext: no edema Neuro: CN II through XII intact, she has noticeable moderate tremor with action such as picking up a water bottle or drinking from a water bottle, no obvious resting tremor, negative Romberg, no obvious cerebellar issues, otherwise unremarkable Psych: Pleasant, good eye contact, answers questions  appropriately    Assessment: Encounter Diagnoses  Name Primary?  . Tremor, physiological Yes  . Abrasion of left hand, initial encounter   . Alcohol use   . Chronic insomnia   . Hearing loss, unspecified hearing loss type, unspecified laterality   . High risk medication use      Plan: She has concerns about the tremor for the last 6 months but looking back in the chart record we apparently even discussed tremor and shakiness back in 2018.  In recent months she feels like it has gotten worse.  Back in 2018 we will check some other labs and other evaluation at that time.  We discussed possible causes.  She does drink alcohol regularly and has been a chronic alcohol drinker typically daily which can be a factor.  She notes that stress can certainly be a factor.  She has limited caffeine use.  She has relied on Ambien long-term.  Given the likelihood of alcohol and stress being triggers, I suspect she could benefit from some medicine such as primidone.  For now though I will defer this and have her see neurology for further evaluation  Abrasion of skin- no deep worrisome wound.  She is doing a good job cleaning and bandaging it.  Counseled on the Td (tetanus, diptheria) vaccine.  Vaccine information sheet given. Td vaccine given after consent obtained.   [name] was seen today for tremors and abrasion.  Diagnoses and all orders for this visit:  Tremor, physiological -     Ambulatory referral to Neurology  Abrasion of left hand, initial encounter  Alcohol use  Chronic insomnia  Hearing loss, unspecified hearing loss type, unspecified laterality  High risk medication use  Other orders -     Td : Tetanus/diphtheria >7yo Preservative  free    Follow up: pending referral

## 2021-01-31 ENCOUNTER — Other Ambulatory Visit: Payer: Self-pay | Admitting: Medical

## 2021-04-02 ENCOUNTER — Other Ambulatory Visit: Payer: Self-pay | Admitting: Medical

## 2021-04-12 ENCOUNTER — Ambulatory Visit: Payer: Medicare HMO | Admitting: Neurology

## 2021-04-12 ENCOUNTER — Encounter: Payer: Self-pay | Admitting: Neurology

## 2021-04-12 VITALS — BP 123/64 | HR 87 | Ht 67.0 in | Wt 158.2 lb

## 2021-04-12 DIAGNOSIS — G250 Essential tremor: Secondary | ICD-10-CM

## 2021-04-12 NOTE — Patient Instructions (Signed)
You have a tremor affecting both hands as well as your head and neck area.  It is possible that you have a hereditary tremor called essential tremor, especially given that your brother also has a tremor.    I do not see any signs or symptoms of parkinson's like disease or what we call parkinsonism.   For your tremor, I would be reluctant to start any new medications for fear of side effects and interaction with alcohol.  If you are able to eliminate your daily alcohol consumption in the near future, I would be happy to consider medication for symptomatic treatment of your tremor, we often utilize a medicine called Mysoline and also as a first-line approach we can think about using a beta-blocker.  You can discuss these options also with your primary care and he may be able to give you more support and resources to be able to eliminate day-to-day alcohol consumption completely.  Please also try to reduce your caffeine intake as caffeine can perpetuate tremors.  For now, we will go ahead with a couple of blood test today.  I did not see a recent thyroid screening test on your list of blood tests.   We do not have to make a scheduled follow up appointment, but I would be happy to see you as needed.   Please remember, that any kind of tremor may be exacerbated by anxiety, anger, nervousness, thyroid disease, excitement, dehydration, sleep deprivation, by caffeine, and low blood sugar values or blood sugar fluctuations. Some medications can exacerbate tremors, this includes certain antidepressant medications including Prozac.

## 2021-04-12 NOTE — Progress Notes (Signed)
Subjective:    Patient ID: Emily Cowan is a 75 y.o. female.  HPI    Emily Age, MD, PhD [facility] [address] [place], [place] [address]  Dear Shanon Brow,  I saw your patient, Emily Cowan, upon your kind request in my neurologic clinic today for initial consultation of her tremor.  The patient is unaccompanied today.  As you know, Emily Cowan is a 75 year old right-handed woman with an underlying medical history of allergic rhinitis, arthritis, hearing loss with bilateral hearing aids, history of liver hemangioma, osteoporosis, daily alcohol consumption (Dx of alcohol abuse by chart review and self admission), insomnia, and reflux disease, who reports a bilateral hand tremor and at times a whole body tremor for the past year.  She feels that it is mildly progressive, very occasionally, especially while driving, the hand that rests in her lap, trembles at rest.  She has not had any falls.  She has a family history of tremor affecting her brother.  She has difficulty holding things and handwriting is tremulous as well.  She is a retired Therapist, music.  She has not had any recent blood work.  Last blood work was about 6 months ago on 10/05/2020.  She did not have a thyroid function test done at the time.  I reviewed your office note from 01/18/2021.  She is single, she lives alone, no children.  She has 1 dog in the household.  She drinks alcohol in the form of wine, 2 glasses/day.  Of note, she also takes Ambien 5 mg each night.  She has a prescription for hydrocodone as needed for back pain.  She has been on Prozac for years, takes 40 mg daily.  She has caffeine in the form of sweet tea, 1 large serving per day and 1 diet soda per day.  She admits that she does not hydrate well with water, estimates that she drinks one 16 ounce bottle of water per day on average.  Her Past Medical History Is Significant For: Past Medical History:  Diagnosis Date    Alcohol abuse    Allergic rhinitis    Allergy    seasonal   Arthritis    Cancer ([place]) 2002   BREAST   Depression    Family history of cancer    multiple   Hearing impaired    wears hearing aids   Hemangioma of liver 2005   MRI    Osteoporosis    Seborrheic dermatitis    Vulvodynia    longstanding, failed Premarin cream; can't tolerate pelvic exams   Wears glasses     Her Past Surgical History Is Significant For: Past Surgical History:  Procedure Laterality Date   cataract surgery     COLONOSCOPY  2011   in [place]  [place]   MASTECTOMY  2002   right   TONSILLECTOMY     WISDOM TOOTH EXTRACTION      Her Family History Is Significant For: Family History  Problem Relation Cowan of Onset   Cancer Mother        GI cancer   Cancer Father        liver   Stroke Father    Cancer Sister        vulvar   Rectal cancer Sister 44   Cancer Brother        liver   Colon polyps Brother 51   Cancer Brother  lung   Diabetes Neg Hx    Heart disease Neg Hx    Hypertension Neg Hx    Hyperlipidemia Neg Hx    Breast cancer Neg Hx    Hemochromatosis Neg Hx    Stomach cancer Neg Hx    Esophageal cancer Neg Hx    Tremor Neg Hx     Her Social History Is Significant For: Social History   Socioeconomic History   Marital status: Single    Spouse name: Not on file   Number of children: Not on file   Years of education: Not on file   Highest education level: Not on file  Occupational History   Not on file  Tobacco Use   Smoking status: Former    Types: Cigarettes    Quit date: 1992    Years since quitting: 30.6   Smokeless tobacco: Never  Vaping Use   Vaping Use: Never used  Substance and Sexual Activity   Alcohol use: Yes    Alcohol/week: 5.0 - 6.0 standard drinks    Types: 5 - 6 Glasses of wine per week   Drug use: No   Sexual activity: Not Currently  Other Topics Concern   Not on file  Social History Narrative   Single, no children, retired,  lesbian but no current relationship; exercise- working in the yard, cutting down trees, Biomedical scientist, walking the dogs.  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   Social Determinants of Health   Financial Resource Strain: Not on file  Food Insecurity: Not on file  Transportation Needs: Not on file  Physical Activity: Not on file  Stress: Not on file  Social Connections: Not on file    Her Allergies Are:  No Known Allergies:   Her Current Medications Are:  Outpatient Encounter Medications as of 04/12/2021  Medication Sig   Ascorbic Acid (VITAMIN C) 500 MG CAPS    FLUoxetine (PROZAC) 40 MG capsule TAKE 1 CAPSULE EVERY DAY   Glucosamine-Chondroit-Vit C-Mn (GLUCOSAMINE CHONDR 1500 COMPLX) CAPS Take 1 capsule by mouth daily.   HYDROcodone-acetaminophen (NORCO) 5-325 MG tablet Take 1 tablet by mouth 2 (two) times daily as needed for moderate pain.   Multiple Vitamins-Minerals (MULTIVITAMIN WITH MINERALS) tablet Take 1 tablet by mouth daily.     omeprazole (PRILOSEC) 40 MG capsule TAKE 1 CAPSULE EVERY DAY   rosuvastatin (CRESTOR) 10 MG tablet Take 1 tablet (10 mg total) by mouth at bedtime.   zolpidem (AMBIEN) 5 MG tablet TAKE 1 TABLET AT BEDTIME AS NEEDED FOR SLEEP   Facility-Administered Encounter Medications as of 04/12/2021  Medication   0.9 %  sodium chloride infusion  :   Review of Systems:  Out of a complete 14 point review of systems, all are reviewed and negative with the exception of these symptoms as listed below:  Review of Systems  Neurological:        Pt is here for tremors. Said tremor effects her whole body. Pt states that the tremors has been going on for a year. States she is not taking anything for it .    Objective:  Neurological Exam  Physical Exam Physical Examination:   Vitals:   04/12/21 0751  BP: 123/64  Pulse: 87    General Examination: The patient is a very pleasant 75 y.o. female in no acute distress. She appears well-developed and well-nourished and well  groomed.   HEENT: Normocephalic, atraumatic, pupils are equal, round and reactive to light, tracking is well-preserved, face is symmetric with normal facial animation  and normal facial sensation, no dysarthria of speech exam, no hypophonia, no obvious voice tremor.  She does have a mild side-to-side head tremor.  Neck is supple, no carotid bruits.  Status post cataract repairs.  Airway examination reveals mild to moderate mouth dryness, tongue protrudes centrally and palate elevates symmetrically.  Hearing is impaired mildly, she has hearing aids.  Chest: Clear to auscultation without wheezing, rhonchi or crackles noted.  Heart: S1+S2+0, regular and normal without murmurs, rubs or gallops noted.   Abdomen: Soft, non-tender and non-distended with normal bowel sounds appreciated on auscultation.  Extremities: There is no pitting edema in the distal lower extremities bilaterally.   Skin: Warm and dry without trophic changes noted.   Musculoskeletal: exam reveals no obvious joint deformities, tenderness or joint swelling or erythema.   Neurologically:  Mental status: The patient is awake, alert and oriented in all 4 spheres. Her immediate and remote memory, attention, language skills and fund of knowledge are appropriate. There is no evidence of aphasia, agnosia, apraxia or anomia. Speech is clear with normal prosody and enunciation. Thought process is linear. Mood is normal and affect is normal.  Cranial nerves II - XII are as described above under HEENT exam.  Motor exam: Normal bulk, strength and tone is noted. There is no obvious resting tremor.  Romberg is not tested due to safety concerns, reflexes are 2-3+ throughout, toes are downgoing bilaterally.   She has fairly preserved finger taps and hand movements, she has a mild postural and action tremor, no significant resting tremor in both upper extremities, no significant lower extremity tremor.  On Archimedes spiral, she has a mild trembling  with the right hand and mild trembling of the left hand, handwriting is legible, not micrographic but tremulous.   Cerebellar testing: No dysmetria or intention tremor. There is no truncal or gait ataxia.  Heel-to-shin and finger-to-nose unremarkable. Sensory exam: intact to light touch, temperature and vibration sense in the upper and lower extremities.  Gait, station and balance: She stands without difficulty, posture is Cowan-appropriate.  She walks without a walking aid, no shuffling, she has preserved arm swing, mild insecurity while walking.  Assessment and Plan:   In summary, Emily Cowan is a very pleasant 75 y.o.-year old female with an underlying medical history of allergic rhinitis, arthritis, hearing loss with bilateral hearing aids, history of liver hemangioma, osteoporosis, daily alcohol consumption (Dx of alcohol abuse by chart review and self admission), insomnia, and reflux disease, who presents for evaluation of her tremor disorder of approximately 1 years duration.  Her history, family history and examination would support essential tremor.  Unfortunately, the clinical picture is confounded by daily alcohol consumption which can actually either suppress tremors and also exacerbate tremors with intermittent alcohol use.  She is advised that certain medications can be utilized as first-line treatment for symptomatic control of essential tremor but I would be worried about side effects including balance issues and interaction with alcohol.  In particular, Mysoline which is considered first-line for essential tremor, may interact with alcohol.  Furthermore, she is already taking a potentially sedating medication in the form of Ambien nightly.  She is advised that Ambien is not well compliant with alcohol as well.  She is advised to talk to about ways to decrease and eliminate day-to-day alcohol use.  We can potentially utilize a symptomatic treatment in the near future but for now I  would favor not adding a medication that could interfere with her current medication regimen,  put her at risk for dizziness, such as a beta-blocker or at risk for balance issues such as Mysoline.  She is advised to stay better hydrated with water as suboptimal hydration can make tremors worse, in addition, she is advised to scale back on her caffeine intake.  She is advised to continue to monitor her symptoms and I would be happy to see her back as needed.  Her neurological exam does not suggest parkinsonism or any other focality.  Since she has not had a recent thyroid function test, I suggested we do some blood work today and call her with the results.  We will check CMP and TSH today.  I answered all her questions today and she was in agreement with the plan.   Thank you very much for allowing me to participate in the care of this nice patient. If I can be of any further assistance to you please do not hesitate to call me at [phone].  Sincerely,   Emily Age, MD, PhD

## 2021-05-28 ENCOUNTER — Other Ambulatory Visit: Payer: Self-pay | Admitting: Medical

## 2021-06-06 ENCOUNTER — Encounter: Payer: Self-pay | Admitting: Internal Medicine

## 2021-06-12 ENCOUNTER — Other Ambulatory Visit: Payer: Medicare HMO

## 2021-06-12 ENCOUNTER — Other Ambulatory Visit: Payer: Self-pay

## 2021-06-12 DIAGNOSIS — M810 Age-related osteoporosis without current pathological fracture: Secondary | ICD-10-CM | POA: Diagnosis not present

## 2021-06-12 MED ORDER — DENOSUMAB 60 MG/ML ~~LOC~~ SOSY
60.0000 mg | PREFILLED_SYRINGE | Freq: Once | SUBCUTANEOUS | Status: AC
Start: 2021-06-12 — End: 2021-06-12
  Administered 2021-06-12: 60 mg via SUBCUTANEOUS

## 2021-08-04 ENCOUNTER — Other Ambulatory Visit: Payer: Self-pay | Admitting: Medical

## 2021-08-04 MED ORDER — ZOLPIDEM TARTRATE 5 MG PO TABS: 5.0000 mg | ORAL_TABLET | Freq: Every evening | ORAL | 0 refills | Status: DC | PRN

## 2021-08-07 ENCOUNTER — Other Ambulatory Visit: Payer: Self-pay | Admitting: Medical

## 2021-10-09 ENCOUNTER — Other Ambulatory Visit: Payer: Self-pay

## 2021-10-09 ENCOUNTER — Ambulatory Visit (INDEPENDENT_AMBULATORY_CARE_PROVIDER_SITE_OTHER): Payer: Medicare HMO | Admitting: Medical

## 2021-10-09 VITALS — BP 124/80 | HR 81 | Ht 68.0 in | Wt 161.8 lb

## 2021-10-09 DIAGNOSIS — K529 Noninfective gastroenteritis and colitis, unspecified: Secondary | ICD-10-CM | POA: Diagnosis not present

## 2021-10-09 DIAGNOSIS — F5104 Psychophysiologic insomnia: Secondary | ICD-10-CM

## 2021-10-09 DIAGNOSIS — M542 Cervicalgia: Secondary | ICD-10-CM | POA: Diagnosis not present

## 2021-10-09 DIAGNOSIS — E559 Vitamin D deficiency, unspecified: Secondary | ICD-10-CM | POA: Diagnosis not present

## 2021-10-09 DIAGNOSIS — R251 Tremor, unspecified: Secondary | ICD-10-CM

## 2021-10-09 DIAGNOSIS — Z974 Presence of external hearing-aid: Secondary | ICD-10-CM

## 2021-10-09 DIAGNOSIS — D369 Benign neoplasm, unspecified site: Secondary | ICD-10-CM | POA: Diagnosis not present

## 2021-10-09 DIAGNOSIS — K219 Gastro-esophageal reflux disease without esophagitis: Secondary | ICD-10-CM

## 2021-10-09 DIAGNOSIS — Z7189 Other specified counseling: Secondary | ICD-10-CM | POA: Diagnosis not present

## 2021-10-09 DIAGNOSIS — Z79899 Other long term (current) drug therapy: Secondary | ICD-10-CM

## 2021-10-09 DIAGNOSIS — Q383 Other congenital malformations of tongue: Secondary | ICD-10-CM

## 2021-10-09 DIAGNOSIS — Z7185 Encounter for immunization safety counseling: Secondary | ICD-10-CM | POA: Diagnosis not present

## 2021-10-09 DIAGNOSIS — Z789 Other specified health status: Secondary | ICD-10-CM | POA: Diagnosis not present

## 2021-10-09 DIAGNOSIS — Z0000 Encounter for general adult medical examination without abnormal findings: Secondary | ICD-10-CM | POA: Diagnosis not present

## 2021-10-09 DIAGNOSIS — M810 Age-related osteoporosis without current pathological fracture: Secondary | ICD-10-CM

## 2021-10-09 DIAGNOSIS — E782 Mixed hyperlipidemia: Secondary | ICD-10-CM

## 2021-10-09 DIAGNOSIS — E2839 Other primary ovarian failure: Secondary | ICD-10-CM

## 2021-10-09 DIAGNOSIS — J301 Allergic rhinitis due to pollen: Secondary | ICD-10-CM | POA: Diagnosis not present

## 2021-10-09 DIAGNOSIS — Z853 Personal history of malignant neoplasm of breast: Secondary | ICD-10-CM

## 2021-10-09 DIAGNOSIS — M5030 Other cervical disc degeneration, unspecified cervical region: Secondary | ICD-10-CM

## 2021-10-09 DIAGNOSIS — Z129 Encounter for screening for malignant neoplasm, site unspecified: Secondary | ICD-10-CM

## 2021-10-09 DIAGNOSIS — Z1159 Encounter for screening for other viral diseases: Secondary | ICD-10-CM

## 2021-10-09 DIAGNOSIS — H9190 Unspecified hearing loss, unspecified ear: Secondary | ICD-10-CM

## 2021-10-09 DIAGNOSIS — F341 Dysthymic disorder: Secondary | ICD-10-CM

## 2021-10-09 DIAGNOSIS — M25511 Pain in right shoulder: Secondary | ICD-10-CM

## 2021-10-09 DIAGNOSIS — G8929 Other chronic pain: Secondary | ICD-10-CM

## 2021-10-09 LAB — CBC

## 2021-10-09 NOTE — Patient Instructions (Signed)
This visit was a preventative care visit, also known as wellness visit or routine physical.   Topics typically include healthy lifestyle, diet, exercise, preventative care, vaccinations, sick and well care, proper use of emergency dept and after hours care, as well as other concerns.     Recommendations: Continue to return yearly for your annual wellness and preventative care visits.  This gives [place] a chance to discuss healthy lifestyle, exercise, vaccinations, review your chart record, and perform screenings where appropriate.  I recommend you see your eye doctor yearly for routine vision care.  I recommend you see your dentist yearly for routine dental care including hygiene visits twice yearly.   Vaccination recommendations were reviewed Immunization History  Administered Date(s) Administered   Fluad Quad(high Dose 65+) 06/18/2019, 05/27/2020   Influenza Whole 08/31/2009, 05/02/2011   Influenza, High Dose Seasonal PF 08/14/2013, 08/17/2014, 06/01/2015, 06/19/2016, 06/20/2017, 06/05/2021   Influenza, Seasonal, Injecte, Preservative Fre 08/22/2012   Moderna Covid-19 Vaccine Bivalent Booster 75yrs & up 06/05/2021   Moderna Sars-Covid-2 Vaccination 10/08/2019, 11/05/2019, 07/04/2020   Pneumococcal Conjugate-13 08/17/2014   Pneumococcal Polysaccharide-23 08/31/2009   Td 01/18/2021   Tdap 09/03/2009   Zoster Recombinat (Shingrix) 06/15/2020   Zoster, Live 08/31/2009    I recommend pneumococcal 20 vaccine, the most recent pneumonia vaccine available  Try and get [place] a copy of the Shingrix No. 2 vaccine information that you had done at a pharmacy in [place], [place]   Screening for cancer: Colon cancer screening: I reviewed your colonoscopy June 2021.  You are due for repeat in 2024 per gastroenterology You noted today that you do not want to pursue any more colonoscopies  Breast cancer screening: You should perform a self breast exam monthly.  It is up to you if you want  to continue mammograms or not We reviewed recommendations for regular mammograms and breast cancer screening.  Skin cancer screening: Check your skin regularly for new changes, growing lesions, or other lesions of concern Come in for evaluation if you have skin lesions of concern.  Lung cancer screening: If you have a greater than 20 pack year history of tobacco use, then you may qualify for lung cancer screening with a chest CT scan.   Please call your insurance company to inquire about coverage for this test.  We currently don't have screenings for other cancers besides breast, cervical, colon, and lung cancers.  If you have a strong family history of cancer or have other cancer screening concerns, please let me know.    Bone health: Get at least 150 minutes of aerobic exercise weekly Get weight bearing exercise at least once weekly Bone density test:  A bone density test is an imaging test that uses a type of X-ray to measure the amount of calcium and other minerals in your bones. The test may be used to diagnose or screen you for a condition that causes weak or thin bones (osteoporosis), predict your risk for a broken bone (fracture), or determine how well your osteoporosis treatment is working. The bone density test is recommended for females 22 and older, or females or males <75 if certain risk factors such as thyroid disease, long term use of steroids such as for asthma or rheumatological issues, vitamin D deficiency, estrogen deficiency, family history of osteoporosis, self or family history of fragility fracture in first degree relative.  Plan to repeat bone density test February 2024  Heart health: Get at least 150 minutes of aerobic exercise weekly Limit alcohol It is  important to maintain a healthy blood pressure and healthy cholesterol numbers  Heart disease screening: Screening for heart disease includes screening for blood pressure, fasting lipids, glucose/diabetes  screening, BMI height to weight ratio, reviewed of smoking status, physical activity, and diet.    Goals include blood pressure 120/80 or less, maintaining a healthy lipid/cholesterol profile, preventing diabetes or keeping diabetes numbers under good control, not smoking or using tobacco products, exercising most days per week or at least 150 minutes per week of exercise, and eating healthy variety of fruits and vegetables, healthy oils, and avoiding unhealthy food choices like fried food, fast food, high sugar and high cholesterol foods.      Medical care options: I recommend you continue to seek care here first for routine care.  We try really hard to have available appointments Monday through Friday daytime hours for sick visits, acute visits, and physicals.  Urgent care should be used for after hours and weekends for significant issues that cannot wait till the next day.  The emergency department should be used for significant potentially life-threatening emergencies.  The emergency department is expensive, can often have long wait times for less significant concerns, so try to utilize primary care, urgent care, or telemedicine when possible to avoid unnecessary trips to the emergency department.  Virtual visits and telemedicine have been introduced since the pandemic started in 2020, and can be convenient ways to receive medical care.  We offer virtual appointments as well to assist you in a variety of options to seek medical care.   Advanced Directives: I recommend you consider completing a [place] and Living Will.   These documents respect your wishes and help alleviate burdens on your loved ones if you were to become terminally ill or be in a position to need those documents enforced.    You can complete Advanced Directives yourself, have them notarized, then have copies made for our office, for you and for anybody you feel should have them in safe keeping.  Or, you can  have an attorney prepare these documents.   If you haven't updated your Last Will and Testament in a while, it may be worthwhile having an attorney prepare these documents together and save on some costs.       Specific issues: Osteoporosis-continue Prolia injections, plan for repeat bone density test February of next year 2024  Hyperlipidemia-continue statin, labs today  GERD-no recent concerns  Chronic insomnia-stable on current medication long term, Emily Cowan

## 2021-10-09 NOTE — Progress Notes (Signed)
Subjective:    Emily Cowan is a 76 y.o. female who presents for Preventative Services visit and chronic medical problems/med check visit.    Primary Care Provider Tysinger, Camelia Eng, PA-C here for primary care  Current Health Care Team: Dentist, -none Eye doctor, Dr. Alveta Heimlich Dr. Zenovia Jarred, GI  Medical Services you may have received from other than [facility] providers in the past year (date may be approximate) None  Exercise Current exercise habits:  some    Nutrition/Diet Current diet: in general, a "healthy" diet    Depression Screen Depression screen Emily Cowan 2/9 10/09/2021  Decreased Interest 0  Down, Depressed, Hopeless 0  PHQ - 2 Score 0  Altered sleeping -  Tired, decreased energy -  Change in appetite -  Feeling bad or failure about yourself  -  Trouble concentrating -  Moving slowly or fidgety/restless -  Suicidal thoughts -  PHQ-9 Score -    Activities of Daily Living Screen/Functional Status Survey Is the patient deaf or have difficulty hearing?: Yes (wears hearing aids) Does the patient have difficulty seeing, even when wearing glasses/contacts?: Yes Does the patient have difficulty concentrating, remembering, or making decisions?: No Does the patient have difficulty walking or climbing stairs?: No Does the patient have difficulty dressing or bathing?: No Does the patient have difficulty doing errands alone such as visiting a doctor's office or shopping?: No  Can patient draw a clock face showing 3:15 oclock, yes  Fall Risk Screen Fall Risk  10/09/2021 10/05/2020 06/23/2018 06/20/2017 06/19/2016  Falls in the past year? 0 0 No No Yes  Number falls in past yr: 0 - - - 1  Injury with Fall? 0 - - - Yes  Risk for fall due to : No Fall Risks No Fall Risks - - Impaired balance/gait  Follow up Falls evaluation completed Falls evaluation completed - - -    Gait Assessment: Normal gait observed yes  Advanced directives Does patient have a Emily Cowan? Yes Does patient have a Living Will? Yes  Past Medical History:  Diagnosis Date   Alcohol abuse    Allergic rhinitis    Allergy    seasonal   Arthritis    Cancer ([place]) 2002   BREAST   Depression    Family history of cancer    multiple   Hearing impaired    wears hearing aids   Hemangioma of liver 2005   MRI    Osteoporosis    Seborrheic dermatitis    Vulvodynia    longstanding, failed Premarin cream; can't tolerate pelvic exams   Wears glasses     Past Surgical History:  Procedure Laterality Date   cataract surgery     COLONOSCOPY  2011   in [place]  2018   MASTECTOMY  2002   right   TONSILLECTOMY     WISDOM TOOTH EXTRACTION      Social History   Socioeconomic History   Marital status: Single    Spouse name: Not on file   Number of children: Not on file   Years of education: Not on file   Highest education level: Not on file  Occupational History   Not on file  Tobacco Use   Smoking status: Former    Types: Cigarettes    Quit date: 20    Years since quitting: 31.1   Smokeless tobacco: Never  Vaping Use   Vaping Use: Never used  Substance and Sexual Activity  Alcohol use: Yes    Alcohol/week: 5.0 - 6.0 standard drinks    Types: 5 - 6 Glasses of wine per week   Drug use: No   Sexual activity: Not Currently  Other Topics Concern   Not on file  Social History Narrative   Single, no children, retired, lesbian but no current relationship; exercise- working in the yard, cutting down trees, Biomedical scientist, walking the dogs.  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   Social Determinants of Health   Financial Resource Strain: Not on file  Food Insecurity: Not on file  Transportation Needs: Not on file  Physical Activity: Not on file  Stress: Not on file  Social Connections: Not on file  Intimate Partner Violence: Not on file    Family History  Problem Relation Age of Onset   Cancer Mother        GI cancer   Cancer Father         liver   Stroke Father    Cancer Sister        vulvar   Rectal cancer Sister 32   Cancer Brother        liver   Colon polyps Brother 71   Cancer Brother        lung   Diabetes Neg Hx    Heart disease Neg Hx    Hypertension Neg Hx    Hyperlipidemia Neg Hx    Breast cancer Neg Hx    Hemochromatosis Neg Hx    Stomach cancer Neg Hx    Esophageal cancer Neg Hx    Tremor Neg Hx      Current Outpatient Medications:    Ascorbic Acid (VITAMIN C) 500 MG CAPS, , Disp: , Rfl:    FLUoxetine (PROZAC) 40 MG capsule, TAKE 1 CAPSULE EVERY DAY, Disp: 90 capsule, Rfl: 1   Glucosamine-Chondroit-Vit C-Mn (GLUCOSAMINE CHONDR 1500 COMPLX) CAPS, Take 1 capsule by mouth daily., Disp: , Rfl:    HYDROcodone-acetaminophen (NORCO) 5-325 MG tablet, Take 1 tablet by mouth 2 (two) times daily as needed for moderate pain., Disp: 30 tablet, Rfl: 0   Multiple Vitamins-Minerals (MULTIVITAMIN WITH MINERALS) tablet, Take 1 tablet by mouth daily.  , Disp: , Rfl:    omeprazole (PRILOSEC) 40 MG capsule, TAKE 1 CAPSULE EVERY DAY, Disp: 90 capsule, Rfl: 0   rosuvastatin (CRESTOR) 10 MG tablet, TAKE 1 TABLET AT BEDTIME, Disp: 90 tablet, Rfl: 0   zolpidem (AMBIEN) 5 MG tablet, Take 1 tablet (5 mg total) by mouth at bedtime as needed. for sleep, Disp: 90 tablet, Rfl: 0  No Known Allergies   History reviewed: allergies, current medications, past family history, past medical history, past social history, past surgical history and problem list  Chronic issues discussed: She continues to live in her tiny home in a private setting by her self  Osteoporosis-she has been using Prolia for about the last year.  Hyperlipidemia-she continues on Crestor without complaint  Mood is fine, continues on fluoxetine  Insomnia, chronic-continues on Ambien regularly  She drinks about 2 glasses of wine per day  Acute issues discussed: Having some dry eyes.   Growth on tongue.  Wants referral to oral surgeon.  She is very  introverted and needs help making this appointment     Objective:      Biometrics BP 124/80    Pulse 81    Ht 5\' 8"  (1.727 m)    Wt 161 lb 12.8 oz (73.4 kg)    BMI 24.60 kg/m   Wt  Readings from Last 3 Encounters:  10/09/21 161 lb 12.8 oz (73.4 kg)  04/12/21 158 lb 3.2 oz (71.8 kg)  01/18/21 160 lb 12.8 oz (72.9 kg)   General: Well-developed well-nourished no acute distress, somewhat frail white female Psych: Pleasant, answers questions appropriately, hard of hearing, hearing aids in place HEENT: normocephalic, sclerae anicteric,  nares patent, no discharge or erythema, pharynx normal Oral cavity: MMM, no lesions Neck: supple, no lymphadenopathy, no thyromegaly, no masses, no bruits Heart: RRR, normal S1, S2, no murmurs Lungs: CTA bilaterally, no wheezes, rhonchi, or rales Abdomen: +bs, soft, non tender, non distended, no masses, no hepatomegaly, no splenomegaly Musculoskeletal: nontender, no swelling, no obvious deformity Extremities: no edema, no cyanosis, no clubbing Pulses: 2+ symmetric, upper and lower extremities, normal cap refill Neurological: alert, oriented x 3, CN2-12 intact, strength normal upper extremities and lower extremities, sensation normal throughout, DTRs 2+ throughout, no cerebellar signs, gait normal Breast, GYN, rectal-declined   Assessment:   Encounter Diagnoses  Name Primary?   Encounter for health maintenance examination in adult Yes   Medicare annual wellness visit, subsequent    Vitamin D deficiency    Vaccine counseling    Tubular adenoma    Tremor, physiological    Osteoporosis, unspecified osteoporosis type, unspecified pathological fracture presence    Advanced directives, counseling/discussion    Alcohol use    Allergic rhinitis due to pollen, unspecified seasonality    Chronic diarrhea    Chronic insomnia    Chronic neck pain    Chronic right shoulder pain    DDD (degenerative disc disease), cervical    Dysthymia    Estrogen  deficiency    Gastroesophageal reflux disease without esophagitis    Hearing loss, unspecified hearing loss type, unspecified laterality    High risk medication use    History of breast cancer in female    Mixed hyperlipidemia    Wears hearing aid    Screening for cancer    Tongue abnormality    Need for hepatitis C screening test      Plan:   This visit was a preventative care visit, also known as wellness visit or routine physical.   Topics typically include healthy lifestyle, diet, exercise, preventative care, vaccinations, sick and well care, proper use of emergency dept and after hours care, as well as other concerns.     Recommendations: Continue to return yearly for your annual wellness and preventative care visits.  This gives [place] a chance to discuss healthy lifestyle, exercise, vaccinations, review your chart record, and perform screenings where appropriate.  I recommend you see your eye doctor yearly for routine vision care.  I recommend you see your dentist yearly for routine dental care including hygiene visits twice yearly.   Vaccination recommendations were reviewed Immunization History  Administered Date(s) Administered   Fluad Quad(high Dose 65+) 06/18/2019, 05/27/2020   Influenza Whole 08/31/2009, 05/02/2011   Influenza, High Dose Seasonal PF 08/14/2013, 08/17/2014, 06/01/2015, 06/19/2016, 06/20/2017, 06/05/2021   Influenza, Seasonal, Injecte, Preservative Fre 08/22/2012   Moderna Covid-19 Vaccine Bivalent Booster 54yrs & up 06/05/2021   Moderna Sars-Covid-2 Vaccination 10/08/2019, 11/05/2019, 07/04/2020   Pneumococcal Conjugate-13 08/17/2014   Pneumococcal Polysaccharide-23 08/31/2009   Td 01/18/2021   Tdap 09/03/2009   Zoster Recombinat (Shingrix) 06/15/2020   Zoster, Live 08/31/2009    I recommend pneumococcal 20 vaccine, the most recent pneumonia vaccine available  Try and get [place] a copy of the Shingrix No. 2 vaccine information that you had done at a  pharmacy  in [place], [place]   Screening for cancer: Colon cancer screening: I reviewed your colonoscopy June 2021.  You are due for repeat in 2024 per gastroenterology You noted today that you do not want to pursue any more colonoscopies  Breast cancer screening: You should perform a self breast exam monthly.  It is up to you if you want to continue mammograms or not We reviewed recommendations for regular mammograms and breast cancer screening.  Skin cancer screening: Check your skin regularly for new changes, growing lesions, or other lesions of concern Come in for evaluation if you have skin lesions of concern.  Lung cancer screening: If you have a greater than 20 pack year history of tobacco use, then you may qualify for lung cancer screening with a chest CT scan.   Please call your insurance company to inquire about coverage for this test.  We currently don't have screenings for other cancers besides breast, cervical, colon, and lung cancers.  If you have a strong family history of cancer or have other cancer screening concerns, please let me know.    Bone health: Get at least 150 minutes of aerobic exercise weekly Get weight bearing exercise at least once weekly Bone density test:  A bone density test is an imaging test that uses a type of X-ray to measure the amount of calcium and other minerals in your bones. The test may be used to diagnose or screen you for a condition that causes weak or thin bones (osteoporosis), predict your risk for a broken bone (fracture), or determine how well your osteoporosis treatment is working. The bone density test is recommended for females 32 and older, or females or males <36 if certain risk factors such as thyroid disease, long term use of steroids such as for asthma or rheumatological issues, vitamin D deficiency, estrogen deficiency, family history of osteoporosis, self or family history of fragility fracture in first degree  relative.  Plan to repeat bone density test February 2024  Heart health: Get at least 150 minutes of aerobic exercise weekly Limit alcohol It is important to maintain a healthy blood pressure and healthy cholesterol numbers  Heart disease screening: Screening for heart disease includes screening for blood pressure, fasting lipids, glucose/diabetes screening, BMI height to weight ratio, reviewed of smoking status, physical activity, and diet.    Goals include blood pressure 120/80 or less, maintaining a healthy lipid/cholesterol profile, preventing diabetes or keeping diabetes numbers under good control, not smoking or using tobacco products, exercising most days per week or at least 150 minutes per week of exercise, and eating healthy variety of fruits and vegetables, healthy oils, and avoiding unhealthy food choices like fried food, fast food, high sugar and high cholesterol foods.      Medical care options: I recommend you continue to seek care here first for routine care.  We try really hard to have available appointments Monday through Friday daytime hours for sick visits, acute visits, and physicals.  Urgent care should be used for after hours and weekends for significant issues that cannot wait till the next day.  The emergency department should be used for significant potentially life-threatening emergencies.  The emergency department is expensive, can often have long wait times for less significant concerns, so try to utilize primary care, urgent care, or telemedicine when possible to avoid unnecessary trips to the emergency department.  Virtual visits and telemedicine have been introduced since the pandemic started in 2020, and can be convenient ways to receive medical  care.  We offer virtual appointments as well to assist you in a variety of options to seek medical care.   Advanced Directives: I recommend you consider completing a [place] and Living Will.   These  documents respect your wishes and help alleviate burdens on your loved ones if you were to become terminally ill or be in a position to need those documents enforced.    You can complete Advanced Directives yourself, have them notarized, then have copies made for our office, for you and for anybody you feel should have them in safe keeping.  Or, you can have an attorney prepare these documents.   If you haven't updated your Last Will and Testament in a while, it may be worthwhile having an attorney prepare these documents together and save on some costs.       Specific issues: Osteoporosis-continue Prolia injections, plan for repeat bone density test February of next year 2024  Hyperlipidemia-continue statin, labs today  GERD-no recent concerns  Chronic insomnia-stable on current medication long term, ambien  Vitamin D deficiency-recheck labs today  Dysthymia-continue fluoxetine Prozac  Tongue growth-referral to oral surgeon  Wears hearing aids    Lutricia was seen today for fasting awv..  Diagnoses and all orders for this visit:  Encounter for health maintenance examination in adult -     Comprehensive metabolic panel -     CBC -     VITAMIN D 25 Hydroxy (Vit-D Deficiency, Fractures) -     Lipid panel -     Cancel: CA 125 -     Ambulatory referral to Oral Maxillofacial Surgery -     TSH -     Hepatitis C antibody -     Vitamin B12  Medicare annual wellness visit, subsequent  Vitamin D deficiency -     VITAMIN D 25 Hydroxy (Vit-D Deficiency, Fractures)  Vaccine counseling  Tubular adenoma  Tremor, physiological  Osteoporosis, unspecified osteoporosis type, unspecified pathological fracture presence  Advanced directives, counseling/discussion  Alcohol use -     TSH -     Vitamin B12  Allergic rhinitis due to pollen, unspecified seasonality  Chronic diarrhea  Chronic insomnia  Chronic neck pain  Chronic right shoulder pain  DDD (degenerative disc  disease), cervical  Dysthymia  Estrogen deficiency  Gastroesophageal reflux disease without esophagitis  Hearing loss, unspecified hearing loss type, unspecified laterality  High risk medication use  History of breast cancer in female  Mixed hyperlipidemia -     Comprehensive metabolic panel -     Lipid panel  Wears hearing aid  Screening for cancer -     Cancel: CA 125  Tongue abnormality -     Ambulatory referral to Oral Maxillofacial Surgery  Need for hepatitis C screening test -     Hepatitis C antibody         Medicare Attestation A preventative services visit was completed today.  During the course of the visit the patient was educated and counseled about appropriate screening and preventive services.  A health risk assessment was established with the patient that included a review of current medications, allergies, social history, family history, medical and preventative health history, biometrics, and preventative screenings to identify potential safety concerns or impairments.  A personalized plan was printed today for the patient's records and use.   Personalized health advice and education was given today to reduce health risks and promote self management and wellness.  Information regarding end of life  planning was discussed today.  Dorothea Ogle, PA-C   10/09/2021

## 2021-10-10 ENCOUNTER — Other Ambulatory Visit: Payer: Self-pay | Admitting: Medical

## 2021-10-10 LAB — COMPREHENSIVE METABOLIC PANEL
ALT: 23 IU/L (ref 0–32)
AST: 18 IU/L (ref 0–40)
Albumin/Globulin Ratio: 2 (ref 1.2–2.2)
Albumin: 4.5 g/dL (ref 3.7–4.7)
Alkaline Phosphatase: 55 IU/L (ref 44–121)
BUN/Creatinine Ratio: 21 (ref 12–28)
BUN: 15 mg/dL (ref 8–27)
Bilirubin Total: 0.4 mg/dL (ref 0.0–1.2)
CO2: 22 mmol/L (ref 20–29)
Calcium: 8.8 mg/dL (ref 8.7–10.3)
Chloride: 105 mmol/L (ref 96–106)
Creatinine, Ser: 0.72 mg/dL (ref 0.57–1.00)
Globulin, Total: 2.3 g/dL (ref 1.5–4.5)
Glucose: 89 mg/dL (ref 70–99)
Potassium: 4.6 mmol/L (ref 3.5–5.2)
Sodium: 139 mmol/L (ref 134–144)
Total Protein: 6.8 g/dL (ref 6.0–8.5)
eGFR: 87 mL/min/{1.73_m2} (ref >59)

## 2021-10-10 LAB — VITAMIN B12: Vitamin B-12: 522 pg/mL (ref 232–1245)

## 2021-10-10 LAB — LIPID PANEL
Chol/HDL Ratio: 2.8 ratio (ref 0.0–4.4)
Cholesterol, Total: 168 mg/dL (ref 100–199)
HDL: 60 mg/dL (ref >39)
LDL Chol Calc (NIH): 89 mg/dL (ref 0–99)
Triglycerides: 107 mg/dL (ref 0–149)
VLDL Cholesterol Cal: 19 mg/dL (ref 5–40)

## 2021-10-10 LAB — CA 125: Cancer Antigen (CA) 125: 6.8 U/mL (ref 0.0–38.1)

## 2021-10-10 LAB — HEPATITIS C ANTIBODY: Hep C Virus Ab: NONREACTIVE

## 2021-10-10 LAB — CBC
Hematocrit: 40.8 % (ref 34.0–46.6)
Hemoglobin: 13.4 g/dL (ref 11.1–15.9)
MCH: 31.2 pg (ref 26.6–33.0)
MCHC: 32.8 g/dL (ref 31.5–35.7)
MCV: 95 fL (ref 79–97)
Platelets: 291 10*3/uL (ref 150–450)
RBC: 4.3 x10E6/uL (ref 3.77–5.28)
RDW: 12.5 % (ref 11.7–15.4)
WBC: 6 10*3/uL (ref 3.4–10.8)

## 2021-10-10 LAB — TSH: TSH: 1.78 u[IU]/mL (ref 0.450–4.500)

## 2021-10-10 LAB — VITAMIN D 25 HYDROXY (VIT D DEFICIENCY, FRACTURES): Vit D, 25-Hydroxy: 42.5 ng/mL (ref 30.0–100.0)

## 2021-11-07 ENCOUNTER — Other Ambulatory Visit: Payer: Self-pay | Admitting: Medical

## 2021-11-08 ENCOUNTER — Other Ambulatory Visit: Payer: Self-pay | Admitting: Medical

## 2021-11-08 MED ORDER — ZOLPIDEM TARTRATE 5 MG PO TABS: 5.0000 mg | ORAL_TABLET | Freq: Every evening | ORAL | 0 refills | Status: DC | PRN

## 2021-11-10 ENCOUNTER — Encounter: Payer: Self-pay | Admitting: Medical

## 2021-11-10 DIAGNOSIS — Z129 Encounter for screening for malignant neoplasm, site unspecified: Secondary | ICD-10-CM | POA: Insufficient documentation

## 2021-12-19 ENCOUNTER — Telehealth: Payer: Self-pay | Admitting: Medical

## 2021-12-19 NOTE — Telephone Encounter (Signed)
Left message for pt to call concerning Prolia. Pt portion is $296. She is ok to schedule and medication needs to be ordered.  ?

## 2021-12-25 ENCOUNTER — Other Ambulatory Visit: Payer: Medicare HMO

## 2021-12-25 DIAGNOSIS — M810 Age-related osteoporosis without current pathological fracture: Secondary | ICD-10-CM

## 2021-12-25 MED ORDER — DENOSUMAB 60 MG/ML ~~LOC~~ SOSY
60.0000 mg | PREFILLED_SYRINGE | Freq: Once | SUBCUTANEOUS | Status: AC
  Administered 2021-12-25: 60 mg via SUBCUTANEOUS

## 2022-02-02 ENCOUNTER — Other Ambulatory Visit: Payer: Self-pay | Admitting: Medical

## 2022-02-02 MED ORDER — ZOLPIDEM TARTRATE 5 MG PO TABS: 5.0000 mg | ORAL_TABLET | Freq: Every evening | ORAL | 0 refills | Status: DC | PRN

## 2022-02-21 ENCOUNTER — Other Ambulatory Visit: Payer: Self-pay | Admitting: Medical

## 2022-03-07 ENCOUNTER — Other Ambulatory Visit: Payer: Self-pay

## 2022-03-07 ENCOUNTER — Ambulatory Visit (INDEPENDENT_AMBULATORY_CARE_PROVIDER_SITE_OTHER): Payer: Medicare HMO | Admitting: Medical

## 2022-03-07 VITALS — BP 120/70 | HR 70 | Wt 161.8 lb

## 2022-03-07 DIAGNOSIS — L509 Urticaria, unspecified: Secondary | ICD-10-CM

## 2022-03-07 DIAGNOSIS — T7840XA Allergy, unspecified, initial encounter: Secondary | ICD-10-CM | POA: Diagnosis not present

## 2022-03-07 MED ORDER — METHYLPREDNISOLONE ACETATE 80 MG/ML IJ SUSP
80.0000 mg | Freq: Once | INTRAMUSCULAR | Status: AC
  Administered 2022-03-07: 80 mg via INTRAMUSCULAR

## 2022-03-07 NOTE — Progress Notes (Signed)
Subjective:  Emily Cowan is a 76 y.o. female who presents for Chief Complaint  Patient presents with   Acute Visit    Hives on upper torso. She has had it for about 2 weeks but it got better until 4 days ago. She has taken Zyrtec and Benadryl last night at 10.     Here for rash, hives.  Started 2 weeks ago.  She thought it was a allergy to detergent and switch detergents.  The rash seemed to calm down but after eating some nuts the last few days the rash is worse.  The rash is very itchy and aggravating.  She has rash on her chest, lower back, bilateral arms and inguinal region.  She has been using Zyrtec and Benadryl with some relief but the rash seems to be getting worse.  No trouble breathing, no wheezing, no other concerns.  No other aggravating or relieving factors.    No other c/o.  The following portions of the patient's history were reviewed and updated as appropriate: allergies, current medications, past family history, past medical history, past social history, past surgical history and problem list.  ROS Otherwise as in subjective above    Objective: BP 120/70   Pulse 70   Wt 161 lb 12.8 oz (73.4 kg)   SpO2 97%   BMI 24.60 kg/m   General appearance: alert, no distress, well developed, well nourished Skin: Several small scattered sub centimeter urticarial lesions on her chest throughout, low back bilaterally, upper arms No dyspnea, no wheezing    Assessment: Encounter Diagnoses  Name Primary?   Urticaria Yes   Allergic reaction, initial encounter      Plan: We discussed possible triggers for this urticarial rash.  Avoid triggers if possible.  She will avoid eating nuts for the time being.  She will use hypoallergenic soap and detergent.  She will continue Benadryl the next few days as she is doing but we will use a dose of 80 mg Depo-Medrol IM today in the office.  She can use the hydrocortisone topical cream she has been using over-the-counter for the  next few days for rash.  If not seeing significant treatments within the next 48 hours call back  Emily Cowan was seen today for acute visit.  Diagnoses and all orders for this visit:  Urticaria  Allergic reaction, initial encounter    Follow up: prn

## 2022-03-07 NOTE — Addendum Note (Signed)
Addended by: Marion Downer on: 03/07/2022 11:59 AM   Modules accepted: Orders

## 2022-03-09 ENCOUNTER — Other Ambulatory Visit: Payer: Self-pay | Admitting: Medical

## 2022-03-09 ENCOUNTER — Telehealth: Payer: Self-pay | Admitting: Internal Medicine

## 2022-03-09 MED ORDER — PREDNISONE 10 MG PO TABS: ORAL_TABLET | ORAL | 0 refills | Status: DC

## 2022-03-09 NOTE — Telephone Encounter (Signed)
Juliann Pulse notified pt

## 2022-03-09 NOTE — Telephone Encounter (Signed)
Pt called and states that she still has hives and wants to know if you will prescribe something for it. Send to [facility]. Pt states the shot had helped that she got in office but still has it

## 2022-04-04 ENCOUNTER — Telehealth: Payer: Self-pay

## 2022-04-04 MED ORDER — ALPRAZOLAM 0.25 MG PO TABS: 0.2500 mg | ORAL_TABLET | Freq: Two times a day (BID) | ORAL | 0 refills | Status: DC | PRN

## 2022-04-04 NOTE — Telephone Encounter (Signed)
Hey, forwarding this to you because the pt has called in again.

## 2022-04-04 NOTE — Telephone Encounter (Signed)
Pt. Called stating she wanted to know if you could call her in something to help her get through her brothers funeral. The funeral is Saturday. She uses [facility].

## 2022-04-04 NOTE — Telephone Encounter (Signed)
Pt. Called back stating you sent the xanax to her mail order and it was supposed to go to her local pharmacy which is [facility]. If you could please switch it to her [facility].

## 2022-04-05 MED ORDER — ALPRAZOLAM 0.25 MG PO TABS
0.2500 mg | ORAL_TABLET | Freq: Two times a day (BID) | ORAL | 0 refills | Status: DC | PRN
Start: 2022-04-05 — End: 2022-10-18

## 2022-05-02 ENCOUNTER — Encounter: Payer: Self-pay | Admitting: Internal Medicine

## 2022-05-17 ENCOUNTER — Telehealth: Payer: Self-pay | Admitting: Medical

## 2022-05-17 NOTE — Telephone Encounter (Signed)
I received note from insurance about medication Xgeva.  I don't see this in the chart.  Is she taking this?  Insurance is wanting to use a cheaper option but I don't see this listed

## 2022-05-18 NOTE — Telephone Encounter (Signed)
Emily Cowan is like prolia and she does this every 6 months. Pt pays $200 and some every 6 months for the Prolia. She is not sure if Zoledronic Acid or Pamidronate will be cost saving to her to switch or not. She says she can switch if its cost saving. Zoledronic Acid is Reclast.

## 2022-05-18 NOTE — Telephone Encounter (Signed)
Forwarded to [place], she handles Prolia

## 2022-06-05 ENCOUNTER — Encounter: Payer: Self-pay | Admitting: Internal Medicine

## 2022-06-21 ENCOUNTER — Telehealth: Payer: Self-pay | Admitting: Medical

## 2022-06-21 NOTE — Telephone Encounter (Signed)
Called pt concerning next Prolia injection. Pt advised that estimated cost is $317.  Pt scheduled an appt on 06/29/2022. Pt reminded to call the office as leaving so medication can be set out. Prolia ordered from [facility].

## 2022-06-29 ENCOUNTER — Other Ambulatory Visit: Payer: Medicare HMO

## 2022-06-29 DIAGNOSIS — M810 Age-related osteoporosis without current pathological fracture: Secondary | ICD-10-CM

## 2022-06-29 MED ORDER — DENOSUMAB 60 MG/ML ~~LOC~~ SOSY
60.0000 mg | PREFILLED_SYRINGE | Freq: Once | SUBCUTANEOUS | Status: AC
  Administered 2022-06-29: 60 mg via SUBCUTANEOUS

## 2022-07-05 DIAGNOSIS — H524 Presbyopia: Secondary | ICD-10-CM | POA: Diagnosis not present

## 2022-07-05 DIAGNOSIS — Z961 Presence of intraocular lens: Secondary | ICD-10-CM | POA: Diagnosis not present

## 2022-07-20 ENCOUNTER — Other Ambulatory Visit: Payer: Self-pay | Admitting: Medical

## 2022-09-06 ENCOUNTER — Telehealth: Payer: Self-pay | Admitting: Medical

## 2022-09-06 NOTE — Telephone Encounter (Signed)
Spoke with patient to schedule Medicare Annual Wellness Visit (AWV) either virtually or in office.   Patient stated she could not schedule right now  she is having problems with her dog  she is having to take him to PT  she stated to call back right before her appt with PCP to see if she could do appt   Last AWV 10/09/21 please schedule with Nurse Health Adviser   45 min for awv-i  in office appointments 30 min for awv-s & awv-i in office/ phone/virtual appointments

## 2022-09-30 ENCOUNTER — Other Ambulatory Visit: Payer: Self-pay | Admitting: Medical

## 2022-10-03 ENCOUNTER — Other Ambulatory Visit: Payer: Self-pay | Admitting: Medical

## 2022-10-03 MED ORDER — ZOLPIDEM TARTRATE 5 MG PO TABS: 5.0000 mg | ORAL_TABLET | Freq: Every evening | ORAL | 0 refills | Status: DC | PRN

## 2022-10-03 NOTE — Telephone Encounter (Signed)
From: Matthew Saras To: Office of [place], [place] Sent: 10/03/2022 2:07 PM EST Subject: Medication Renewal Request  Refills have been requested for the following medications:   zolpidem (AMBIEN) 5 MG tablet [Shane Ahmet Schank]  Patient Comment: Have appt. This month. Please refill  Preferred pharmacy: [facility], [address] Delivery method: Mail

## 2022-10-04 ENCOUNTER — Other Ambulatory Visit: Payer: Self-pay | Admitting: Medical

## 2022-10-18 ENCOUNTER — Encounter: Payer: Self-pay | Admitting: Medical

## 2022-10-18 ENCOUNTER — Ambulatory Visit (INDEPENDENT_AMBULATORY_CARE_PROVIDER_SITE_OTHER): Payer: Medicare HMO | Admitting: Medical

## 2022-10-18 VITALS — BP 134/80 | HR 72 | Ht 67.0 in | Wt 160.0 lb

## 2022-10-18 DIAGNOSIS — F341 Dysthymic disorder: Secondary | ICD-10-CM | POA: Diagnosis not present

## 2022-10-18 DIAGNOSIS — J301 Allergic rhinitis due to pollen: Secondary | ICD-10-CM

## 2022-10-18 DIAGNOSIS — Z23 Encounter for immunization: Secondary | ICD-10-CM | POA: Diagnosis not present

## 2022-10-18 DIAGNOSIS — Z789 Other specified health status: Secondary | ICD-10-CM

## 2022-10-18 DIAGNOSIS — E2839 Other primary ovarian failure: Secondary | ICD-10-CM

## 2022-10-18 DIAGNOSIS — Z974 Presence of external hearing-aid: Secondary | ICD-10-CM | POA: Diagnosis not present

## 2022-10-18 DIAGNOSIS — M5030 Other cervical disc degeneration, unspecified cervical region: Secondary | ICD-10-CM

## 2022-10-18 DIAGNOSIS — F1011 Alcohol abuse, in remission: Secondary | ICD-10-CM

## 2022-10-18 DIAGNOSIS — Z7185 Encounter for immunization safety counseling: Secondary | ICD-10-CM | POA: Diagnosis not present

## 2022-10-18 DIAGNOSIS — K219 Gastro-esophageal reflux disease without esophagitis: Secondary | ICD-10-CM

## 2022-10-18 DIAGNOSIS — Z0000 Encounter for general adult medical examination without abnormal findings: Secondary | ICD-10-CM

## 2022-10-18 DIAGNOSIS — R251 Tremor, unspecified: Secondary | ICD-10-CM

## 2022-10-18 DIAGNOSIS — Z7189 Other specified counseling: Secondary | ICD-10-CM

## 2022-10-18 DIAGNOSIS — M810 Age-related osteoporosis without current pathological fracture: Secondary | ICD-10-CM

## 2022-10-18 DIAGNOSIS — Z79899 Other long term (current) drug therapy: Secondary | ICD-10-CM

## 2022-10-18 DIAGNOSIS — F5104 Psychophysiologic insomnia: Secondary | ICD-10-CM

## 2022-10-18 DIAGNOSIS — E782 Mixed hyperlipidemia: Secondary | ICD-10-CM

## 2022-10-18 DIAGNOSIS — E559 Vitamin D deficiency, unspecified: Secondary | ICD-10-CM

## 2022-10-18 DIAGNOSIS — D369 Benign neoplasm, unspecified site: Secondary | ICD-10-CM

## 2022-10-18 DIAGNOSIS — G3184 Mild cognitive impairment, so stated: Secondary | ICD-10-CM | POA: Insufficient documentation

## 2022-10-18 NOTE — Patient Instructions (Signed)
Separate significant issues discussed: Chronic insomnia-you have been on Ambien long-term and seemed to do okay with this despite the risk of medication.  Dysthymia, depression-you seem to do well on fluoxetine Prozac.  Continue current dose  Osteoporosis-continue Prolia injection, get weightbearing and aerobic exercise, get at least 2000 units or more vitamin D daily and at least 1200 mg of calcium daily  Acid reflux-you continue on Prilosec.  Avoid acid reflux trigger foods.  You tend to not do well off this medication  Mild cognitive impairment-your Mini-Mental status exam was okay today.  I will check routine labs today.  Stay active with reading and interacting with others to keep your mind strong.  If your memory continues to be a concern we can refer you to neurology for further evaluation  Limit alcohol  High cholesterol-continue current medication, updated labs today   General Recommendations: Continue to return yearly for your annual wellness and preventative care visits.  This gives [place] a chance to discuss healthy lifestyle, exercise, vaccinations, review your chart record, and perform screenings where appropriate.  I recommend you see your eye doctor yearly for routine vision care.  I recommend you see your dentist yearly for routine dental care including hygiene visits twice yearly.   Vaccination recommendations were reviewed Immunization History  Administered Date(s) Administered   Fluad Quad(high Dose 65+) 06/18/2019, 05/27/2020   Influenza Whole 08/31/2009, 05/02/2011   Influenza, High Dose Seasonal PF 08/14/2013, 08/17/2014, 06/01/2015, 06/19/2016, 06/20/2017, 06/05/2021   Influenza, Seasonal, Injecte, Preservative Fre 08/22/2012   Influenza-Unspecified 06/05/2022   Moderna Covid-19 Vaccine Bivalent Booster 30yr & up 06/05/2021   Moderna Sars-Covid-2 Vaccination 10/08/2019, 11/05/2019, 07/04/2020, 06/05/2022   Pneumococcal Conjugate-13 08/17/2014   Pneumococcal  Polysaccharide-23 08/31/2009   Td 01/18/2021   Tdap 09/03/2009   Zoster Recombinat (Shingrix) 06/15/2020, 10/12/2020   Zoster, Live 08/31/2009    Vaccine recommendations: Pneumococcal  Counseled on the pneumococcal vaccine.  Vaccine information sheet given.  Pneumococcal vaccine Prevnar 20 given after consent obtained.    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  She is due repeat this year but she declines to do any more colon cancer testing   Breast cancer screening: After age 77 routine mammogram screening is not recommended.  It is up to you whether you continue to do screenings or not  Skin cancer screening: Check your skin regularly for new changes, growing lesions, or other lesions of concern Come in for evaluation if you have skin lesions of concern.  Lung cancer screening: If you have a greater than 20 pack year history of tobacco use, then you may qualify for lung cancer screening with a chest CT scan.   Please call your insurance company to inquire about coverage for this test.  Pancreatic cancer: no current screening test is available routinely recommended.  (Risk factors: Smoking, overweight or obese, diabetes, chronic pancreatitis, work eAeronautical engineer m[employer]uary 89year old or greater, female greater than female, African-American, family history of pancreatic cancer, hereditary breast, ovarian, melanoma, Lynch, Peutz-jeghers).  We currently don't have screenings for other cancers besides breast, cervical, colon, and lung cancers.  If you have a strong family history of cancer or have other cancer screening concerns, please let me know.    Bone health: Get at least 150 minutes of aerobic exercise weekly Get weight bearing exercise at least once weekly Bone density test:  A bone density test is an imaging test that uses a type of X-ray to measure  the amount of calcium and  other minerals in your bones. The test may be used to diagnose or screen you for a condition that causes weak or thin bones (osteoporosis), predict your risk for a broken bone (fracture), or determine how well your osteoporosis treatment is working. The bone density test is recommended for females 51 and older, or females or males [id number] if certain risk factors such as thyroid disease, long term use of steroids such as for asthma or rheumatological issues, vitamin D deficiency, estrogen deficiency, family history of osteoporosis, self or family history of fragility fracture in first degree relative.  You are due for updated bone density test at this time.  Please call and schedule this  You are currently on Prolia therapy  Please call to schedule your bone density.   [facility]  [phone] [address] [place], [place] [address]   Heart health: Get at least 150 minutes of aerobic exercise weekly Limit alcohol It is important to maintain a healthy blood pressure and healthy cholesterol numbers  Heart disease screening: Screening for heart disease includes screening for blood pressure, fasting lipids, glucose/diabetes screening, BMI height to weight ratio, reviewed of smoking status, physical activity, and diet.    Goals include blood pressure 120/80 or less, maintaining a healthy lipid/cholesterol profile, preventing diabetes or keeping diabetes numbers under good control, not smoking or using tobacco products, exercising most days per week or at least 150 minutes per week of exercise, and eating healthy variety of fruits and vegetables, healthy oils, and avoiding unhealthy food choices like fried food, fast food, high sugar and high cholesterol foods.    Other tests may possibly include EKG test, CT coronary calcium score, echocardiogram, exercise treadmill stress test.     Vascular disease screening: For high risk individuals including  smokers, diabetes, patients with known heart disease or high blood pressure, kidney disease, and others, screening for vascular disease or atherosclerosis of the arteries is available.  Examples may include carotid ultrasound, abdominal aortic ultrasound, ABI blood flow screening in the legs, thoracic aorta screening.   Medical care options: I recommend you continue to seek care here first for routine care.  We try really hard to have available appointments Monday through Friday daytime hours for sick visits, acute visits, and physicals.  Urgent care should be used for after hours and weekends for significant issues that cannot wait till the next day.  The emergency department should be used for significant potentially life-threatening emergencies.  The emergency department is expensive, can often have long wait times for less significant concerns, so try to utilize primary care, urgent care, or telemedicine when possible to avoid unnecessary trips to the emergency department.  Virtual visits and telemedicine have been introduced since the pandemic started in 2020, and can be convenient ways to receive medical care.  We offer virtual appointments as well to assist you in a variety of options to seek medical care.   Legal Take the time to do a Last Will and Testament, advanced directives including Healthcare Power of Attorney and Living Will documents.  Do not leave your family with burdens that can be handled ahead of time.   Advanced Directives: I recommend you consider completing a [place] and Living Will.   These documents respect your wishes and help alleviate burdens on your loved ones if you were to become terminally ill or be in a position to need those documents enforced.  You can complete Advanced Directives yourself, have them notarized, then have copies made for our office, for you and for anybody you feel should have them in safe keeping.  Or, you can have an  attorney prepare these documents.   If you haven't updated your Last Will and Testament in a while, it may be worthwhile having an attorney prepare these documents together and save on some costs.       Spiritual and Emotional Health Keeping a healthy spiritual life can help you better manage your physical health. Your spiritual life can help you to cope with any issues that may arise with your physical health.  Balance can keep [place] healthy and help [place] to recover.  If you are struggling with your spiritual health there are questions that you may want to ask yourself:  What makes me feel most complete? When do I feel most connected to the rest of the world? Where do I find the most inner strength? What am I doing when I feel whole?  Helpful tips: Being in nature. Some people feel very connected and at peace when they are walking outdoors or are outside. Helping others. Some feel the largest sense of wellbeing when they are of service to others. Being of service can take on many forms. It can be doing volunteer work, being kind to strangers, or offering a hand to a friend in need. Gratitude. Some people find they feel the most connected when they remain grateful. They may make lists of all the things they are grateful for or say a thank you out loud for all they have.    Emotional Health Are you in tune with your emotional health?  Check out this link: http://www.bray.com/   Financial Health Make sure you use a budget for your personal finances Make sure you are insured against risks (health insurance, life insurance, auto insurance, etc) Save more, spend less Set financial goals If you need help in this area, good resources include counseling through [employer] or other community resources, have a meeting with a Emergency planning/management officer, and a good resource is [employer]

## 2022-10-18 NOTE — Progress Notes (Signed)
Subjective:   HPI  Emily Cowan is a 77 y.o. female who presents for Chief Complaint  Patient presents with   Annual Exam    Fasting annual exam. Filled rx for prozac for only one month, she is wondering why? Had one event of vertigo. Needs refill on her ambien.     Patient Care Team: Nechuma Boven, Camelia Eng, PA-C as PCP - General (Family Medicine)   Concerns: Under a lot of stress currently as her daughter recently tore a ligament in his status post surgery  She is isolated but does interact with some friends.  She has an app that she takes on daily to lower her family and friends that she is safe and doing well  She is compliant with her cholesterol medicine daily without complaint  Osteoporosis-she is getting weightbearing and aerobic exercise, takes calcium and vitamin D daily and is on Prolia injection every 6 months  History of depression-currently on fluoxetine and does well on this  Acid reflux-she continues on pravastatin   Exercise My current exercise reported today is: Gardening, walking, some weight bearing   Eating habits: My current eating habits include: Variety of fruits and vegetables   Reviewed their medical, surgical, family, social, medication, and allergy history and updated chart as appropriate.  No Known Allergies  Past Medical History:  Diagnosis Date   Alcohol abuse    Allergic rhinitis    Allergy    seasonal   Arthritis    Cancer ([place]) 2002   BREAST   Depression    Family history of cancer    multiple   Hearing impaired    wears hearing aids   Hemangioma of liver 2005   MRI    Osteoporosis    Seborrheic dermatitis    Vulvodynia    longstanding, failed Premarin cream; can't tolerate pelvic exams   Wears glasses     Current Outpatient Medications on File Prior to Visit  Medication Sig Dispense Refill   Ascorbic Acid (VITAMIN C) 500 MG CAPS      FLUoxetine (PROZAC) 40 MG capsule TAKE 1 CAPSULE EVERY DAY 30 capsule 0    Glucosamine-Chondroit-Vit C-Mn (GLUCOSAMINE CHONDR 1500 COMPLX) CAPS Take 1 capsule by mouth daily.     Multiple Vitamins-Minerals (MULTIVITAMIN WITH MINERALS) tablet Take 1 tablet by mouth daily.       omeprazole (PRILOSEC) 40 MG capsule TAKE 1 CAPSULE EVERY DAY 90 capsule 1   rosuvastatin (CRESTOR) 10 MG tablet TAKE 1 TABLET AT BEDTIME 90 tablet 1   zolpidem (AMBIEN) 5 MG tablet Take 1 tablet (5 mg total) by mouth at bedtime as needed. for sleep 90 tablet 0   No current facility-administered medications on file prior to visit.      Current Outpatient Medications:    Ascorbic Acid (VITAMIN C) 500 MG CAPS, , Disp: , Rfl:    FLUoxetine (PROZAC) 40 MG capsule, TAKE 1 CAPSULE EVERY DAY, Disp: 30 capsule, Rfl: 0   Glucosamine-Chondroit-Vit C-Mn (GLUCOSAMINE CHONDR 1500 COMPLX) CAPS, Take 1 capsule by mouth daily., Disp: , Rfl:    Multiple Vitamins-Minerals (MULTIVITAMIN WITH MINERALS) tablet, Take 1 tablet by mouth daily.  , Disp: , Rfl:    omeprazole (PRILOSEC) 40 MG capsule, TAKE 1 CAPSULE EVERY DAY, Disp: 90 capsule, Rfl: 1   rosuvastatin (CRESTOR) 10 MG tablet, TAKE 1 TABLET AT BEDTIME, Disp: 90 tablet, Rfl: 1   zolpidem (AMBIEN) 5 MG tablet, Take 1 tablet (5 mg total) by mouth at bedtime as needed. for sleep,  Disp: 90 tablet, Rfl: 0  Family History  Problem Relation Age of Onset   Cancer Mother        GI cancer   Cancer Father        liver   Stroke Father    Cancer Sister        vulvar   Rectal cancer Sister 59   Cancer Brother        liver   Colon polyps Brother 35   Cancer Brother        lung   Diabetes Neg Hx    Heart disease Neg Hx    Hypertension Neg Hx    Hyperlipidemia Neg Hx    Breast cancer Neg Hx    Hemochromatosis Neg Hx    Stomach cancer Neg Hx    Esophageal cancer Neg Hx    Tremor Neg Hx     Past Surgical History:  Procedure Laterality Date   cataract surgery     COLONOSCOPY  2011   in [place]  2018   MASTECTOMY  2002   right    TONSILLECTOMY     WISDOM TOOTH EXTRACTION       Review of Systems  Constitutional:  Negative for chills, fever, malaise/fatigue and weight loss.  HENT:  Negative for congestion, ear pain, hearing loss, sore throat and tinnitus.   Eyes:  Negative for blurred vision, pain and redness.  Respiratory:  Negative for cough, hemoptysis and shortness of breath.   Cardiovascular:  Negative for chest pain, palpitations, orthopnea, claudication and leg swelling.  Gastrointestinal:  Negative for abdominal pain, blood in stool, constipation, diarrhea, nausea and vomiting.  Genitourinary:  Negative for dysuria, flank pain, frequency, hematuria and urgency.  Musculoskeletal:  Negative for falls, joint pain and myalgias.  Skin:  Negative for itching and rash.  Neurological:  Negative for dizziness, tingling, speech change, weakness and headaches.  Endo/Heme/Allergies:  Negative for polydipsia. Does not bruise/bleed easily.  Psychiatric/Behavioral:  Positive for depression and memory loss. The patient has insomnia. The patient is not nervous/anxious.         Objective:  BP 134/80   Pulse 72   Ht 5' 7"$  (1.702 m)   Wt 160 lb (72.6 kg)   BMI 25.06 kg/m   General appearance: alert, no distress, WD/WN, Caucasian female, frail Skin: scattered macules, no worrisome lesions HEENT: normocephalic, conjunctiva/corneas normal, sclerae anicteric, PERRLA, EOMi, nares patent, no discharge or erythema, pharynx normal Oral cavity: MMM, tongue normal, teeth normal Neck: supple, no lymphadenopathy, no thyromegaly, no masses, normal ROM, no bruits Chest: non tender, normal shape and expansion Heart: RRR, normal S1, S2, no murmurs Lungs: CTA bilaterally, no wheezes, rhonchi, or rales Abdomen: +bs, soft, non tender, non distended, no masses, no hepatomegaly, no splenomegaly, no bruits Back: non tender, normal ROM, no scoliosis Musculoskeletal: upper extremities non tender, no obvious deformity, normal ROM  throughout, lower extremities non tender, no obvious deformity, normal ROM throughout Extremities: no edema, no cyanosis, no clubbing Pulses: 2+ symmetric, upper and lower extremities, normal cap refill Neurological: alert, oriented x 3, CN2-12 intact, strength normal upper extremities and lower extremities, sensation normal throughout, DTRs 2+ throughout, no cerebellar signs, gait normal Psychiatric: normal affect, behavior normal, pleasant  Breast/gyn/rectal - declined    Assessment and Plan :   Encounter Diagnoses  Name Primary?   Encounter for health maintenance examination in adult Yes   Dysthymia    Alcohol use    Advanced directives, counseling/discussion  Vaccine counseling    Vitamin D deficiency    Wears hearing aid    Tubular adenoma    Tremor, physiological    Mixed hyperlipidemia    History of alcohol abuse    High risk medication use    Estrogen deficiency    Chronic insomnia    Osteoporosis, unspecified osteoporosis type, unspecified pathological fracture presence    DDD (degenerative disc disease), cervical    Allergic rhinitis due to pollen, unspecified seasonality    Gastroesophageal reflux disease without esophagitis    Mild cognitive impairment    Need for pneumococcal 20-valent conjugate vaccination      This visit was a preventative care visit, also known as wellness visit or routine physical.   Topics typically include healthy lifestyle, diet, exercise, preventative care, vaccinations, sick and well care, proper use of emergency dept and after hours care, as well as other concerns.    Separate significant issues discussed: Chronic insomnia-you have been on Ambien long-term and seemed to do okay with this despite the risk of medication.  Dysthymia, depression-you seem to do well on fluoxetine Prozac.  Continue current dose  Osteoporosis-continue Prolia injection, get weightbearing and aerobic exercise, get at least 2000 units or more vitamin D daily  and at least 1200 mg of calcium daily  Acid reflux-you continue on Prilosec.  Avoid acid reflux trigger foods.  You tend to not do well off this medication  Mild cognitive impairment-your Mini-Mental status exam was okay today.  I will check routine labs today.  Stay active with reading and interacting with others to keep your mind strong.  If your memory continues to be a concern we can refer you to neurology for further evaluation  Limit alcohol  High cholesterol-continue current medication, updated labs today   General Recommendations: Continue to return yearly for your annual wellness and preventative care visits.  This gives [place] a chance to discuss healthy lifestyle, exercise, vaccinations, review your chart record, and perform screenings where appropriate.  I recommend you see your eye doctor yearly for routine vision care.  I recommend you see your dentist yearly for routine dental care including hygiene visits twice yearly.   Vaccination recommendations were reviewed Immunization History  Administered Date(s) Administered   Fluad Quad(high Dose 65+) 06/18/2019, 05/27/2020   Influenza Whole 08/31/2009, 05/02/2011   Influenza, High Dose Seasonal PF 08/14/2013, 08/17/2014, 06/01/2015, 06/19/2016, 06/20/2017, 06/05/2021   Influenza, Seasonal, Injecte, Preservative Fre 08/22/2012   Influenza-Unspecified 06/05/2022   Moderna Covid-19 Vaccine Bivalent Booster 35yr & up 06/05/2021   Moderna Sars-Covid-2 Vaccination 10/08/2019, 11/05/2019, 07/04/2020, 06/05/2022   PNEUMOCOCCAL CONJUGATE-20 10/18/2022   Pneumococcal Conjugate-13 08/17/2014   Pneumococcal Polysaccharide-23 08/31/2009   Td 01/18/2021   Tdap 09/03/2009   Zoster Recombinat (Shingrix) 06/15/2020, 10/12/2020   Zoster, Live 08/31/2009    Vaccine recommendations: Pneumococcal  Counseled on the pneumococcal vaccine.  Vaccine information sheet given.  Pneumococcal vaccine Prevnar 20 given after consent  obtained.    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  She is due repeat this year but she declines to do any more colon cancer testing   Breast cancer screening: After age 77 routine mammogram screening is not recommended.  It is up to you whether you continue to do screenings or not  Skin cancer screening: Check your skin regularly for new changes, growing lesions, or other lesions of concern Come in for evaluation if you have skin lesions of concern.  Lung cancer screening: If you have a greater than 20 pack year history of tobacco use, then you may qualify for lung cancer screening with a chest CT scan.   Please call your insurance company to inquire about coverage for this test.  Pancreatic cancer: no current screening test is available routinely recommended.  (Risk factors: Smoking, overweight or obese, diabetes, chronic pancreatitis, work Aeronautical engineer, Actuary, 65 year old or greater, female greater than female, African-American, family history of pancreatic cancer, hereditary breast, ovarian, melanoma, Lynch, Peutz-jeghers).  We currently don't have screenings for other cancers besides breast, cervical, colon, and lung cancers.  If you have a strong family history of cancer or have other cancer screening concerns, please let me know.    Bone health: Get at least 150 minutes of aerobic exercise weekly Get weight bearing exercise at least once weekly Bone density test:  A bone density test is an imaging test that uses a type of X-ray to measure the amount of calcium and other minerals in your bones. The test may be used to diagnose or screen you for a condition that causes weak or thin bones (osteoporosis), predict your risk for a broken bone (fracture), or determine how well your osteoporosis treatment is working. The bone density test is recommended for females 12 and older, or  females or males [id number] if certain risk factors such as thyroid disease, long term use of steroids such as for asthma or rheumatological issues, vitamin D deficiency, estrogen deficiency, family history of osteoporosis, self or family history of fragility fracture in first degree relative.  You are due for updated bone density test at this time.  Please call and schedule this  You are currently on Prolia therapy  Please call to schedule your bone density.   [facility]  [phone], [place], [place] [address]   Heart health: Get at least 150 minutes of aerobic exercise weekly Limit alcohol It is important to maintain a healthy blood pressure and healthy cholesterol numbers  Heart disease screening: Screening for heart disease includes screening for blood pressure, fasting lipids, glucose/diabetes screening, BMI height to weight ratio, reviewed of smoking status, physical activity, and diet.    Goals include blood pressure 120/80 or less, maintaining a healthy lipid/cholesterol profile, preventing diabetes or keeping diabetes numbers under good control, not smoking or using tobacco products, exercising most days per week or at least 150 minutes per week of exercise, and eating healthy variety of fruits and vegetables, healthy oils, and avoiding unhealthy food choices like fried food, fast food, high sugar and high cholesterol foods.    Other tests may possibly include EKG test, CT coronary calcium score, echocardiogram, exercise treadmill stress test.     Vascular disease screening: For high risk individuals including smokers, diabetes, patients with known heart disease or high blood pressure, kidney disease, and others, screening for vascular disease or atherosclerosis of the arteries is available.  Examples may include carotid ultrasound, abdominal aortic ultrasound, ABI blood flow screening in the legs, thoracic aorta  screening.   Medical care options: I recommend you continue to seek care here first for routine care.  We try really hard to have available appointments Monday through Friday daytime hours for sick visits, acute visits, and physicals.  Urgent care should be used for after hours and weekends for significant issues that cannot wait till the next day.  The emergency department should be used for significant potentially life-threatening emergencies.  The emergency department  is expensive, can often have long wait times for less significant concerns, so try to utilize primary care, urgent care, or telemedicine when possible to avoid unnecessary trips to the emergency department.  Virtual visits and telemedicine have been introduced since the pandemic started in 2020, and can be convenient ways to receive medical care.  We offer virtual appointments as well to assist you in a variety of options to seek medical care.   Legal Take the time to do a Last Will and Testament, advanced directives including Healthcare Power of Attorney and Living Will documents.  Do not leave your family with burdens that can be handled ahead of time.   Advanced Directives: I recommend you consider completing a [place] and Living Will.   These documents respect your wishes and help alleviate burdens on your loved ones if you were to become terminally ill or be in a position to need those documents enforced.    You can complete Advanced Directives yourself, have them notarized, then have copies made for our office, for you and for anybody you feel should have them in safe keeping.  Or, you can have an attorney prepare these documents.   If you haven't updated your Last Will and Testament in a while, it may be worthwhile having an attorney prepare these documents together and save on some costs.       Spiritual and Emotional Health Keeping a healthy spiritual life can help you better manage your physical  health. Your spiritual life can help you to cope with any issues that may arise with your physical health.  Balance can keep [place] healthy and help [place] to recover.  If you are struggling with your spiritual health there are questions that you may want to ask yourself:  What makes me feel most complete? When do I feel most connected to the rest of the world? Where do I find the most inner strength? What am I doing when I feel whole?  Helpful tips: Being in nature. Some people feel very connected and at peace when they are walking outdoors or are outside. Helping others. Some feel the largest sense of wellbeing when they are of service to others. Being of service can take on many forms. It can be doing volunteer work, being kind to strangers, or offering a hand to a friend in need. Gratitude. Some people find they feel the most connected when they remain grateful. They may make lists of all the things they are grateful for or say a thank you out loud for all they have.    Emotional Health Are you in tune with your emotional health?  Check out this link: http://www.bray.com/   Financial Health Make sure you use a budget for your personal finances Make sure you are insured against risks (health insurance, life insurance, auto insurance, etc) Save more, spend less Set financial goals If you need help in this area, good resources include counseling through [employer] or other community resources, have a meeting with a Emergency planning/management officer, and a good resource is Luvenia Heller podcast    Emily Cowan was seen today for annual exam.  Diagnoses and all orders for this visit:  Encounter for health maintenance examination in adult -     Comprehensive metabolic panel -     CBC -     Lipid panel -     TSH -     Vitamin B12 -     Cancel: Vitamin B1 -  Vitamin B1  Dysthymia -     TSH  Alcohol use -     DG Bone Density; Future  Advanced directives,  counseling/discussion  Vaccine counseling  Vitamin D deficiency  Wears hearing aid  Tubular adenoma  Tremor, physiological -     Vitamin B1  Mixed hyperlipidemia -     Lipid panel  History of alcohol abuse -     TSH -     Vitamin B12 -     Cancel: Vitamin B1 -     Vitamin B1  High risk medication use  Estrogen deficiency -     DG Bone Density; Future  Chronic insomnia  Osteoporosis, unspecified osteoporosis type, unspecified pathological fracture presence -     DG Bone Density; Future  DDD (degenerative disc disease), cervical  Allergic rhinitis due to pollen, unspecified seasonality  Gastroesophageal reflux disease without esophagitis  Mild cognitive impairment  Need for pneumococcal 20-valent conjugate vaccination -     Pneumococcal conjugate vaccine 20-valent (Prevnar 20)     Follow-up pending labs, yearly for physical

## 2022-10-19 LAB — CBC
Hematocrit: 40.6 % (ref 34.0–46.6)
Hemoglobin: 13.6 g/dL (ref 11.1–15.9)
MCH: 31.4 pg (ref 26.6–33.0)
MCHC: 33.5 g/dL (ref 31.5–35.7)
MCV: 94 fL (ref 79–97)
Platelets: 305 10*3/uL (ref 150–450)
RBC: 4.33 x10E6/uL (ref 3.77–5.28)
RDW: 12.7 % (ref 11.7–15.4)
WBC: 6.1 10*3/uL (ref 3.4–10.8)

## 2022-10-19 LAB — COMPREHENSIVE METABOLIC PANEL
ALT: 21 IU/L (ref 0–32)
AST: 19 IU/L (ref 0–40)
Albumin/Globulin Ratio: 1.8 (ref 1.2–2.2)
Albumin: 4.4 g/dL (ref 3.8–4.8)
Alkaline Phosphatase: 58 IU/L (ref 44–121)
BUN/Creatinine Ratio: 23 (ref 12–28)
BUN: 15 mg/dL (ref 8–27)
Bilirubin Total: 0.4 mg/dL (ref 0.0–1.2)
CO2: 21 mmol/L (ref 20–29)
Calcium: 9.3 mg/dL (ref 8.7–10.3)
Chloride: 105 mmol/L (ref 96–106)
Creatinine, Ser: 0.66 mg/dL (ref 0.57–1.00)
Globulin, Total: 2.4 g/dL (ref 1.5–4.5)
Glucose: 90 mg/dL (ref 70–99)
Potassium: 4.8 mmol/L (ref 3.5–5.2)
Sodium: 140 mmol/L (ref 134–144)
Total Protein: 6.8 g/dL (ref 6.0–8.5)
eGFR: 91 mL/min/{1.73_m2} (ref >59)

## 2022-10-19 LAB — LIPID PANEL
Chol/HDL Ratio: 2.8 ratio (ref 0.0–4.4)
Cholesterol, Total: 174 mg/dL (ref 100–199)
HDL: 63 mg/dL (ref >39)
LDL Chol Calc (NIH): 91 mg/dL (ref 0–99)
Triglycerides: 112 mg/dL (ref 0–149)
VLDL Cholesterol Cal: 20 mg/dL (ref 5–40)

## 2022-10-19 LAB — VITAMIN B12: Vitamin B-12: 490 pg/mL (ref 232–1245)

## 2022-10-19 LAB — TSH: TSH: 1.77 u[IU]/mL (ref 0.450–4.500)

## 2022-10-19 NOTE — Progress Notes (Signed)
Results sent through MyChart

## 2022-10-24 LAB — VITAMIN B1: Thiamine: 122.2 nmol/L (ref 66.5–200.0)

## 2022-10-25 ENCOUNTER — Other Ambulatory Visit: Payer: Self-pay | Admitting: Medical

## 2022-11-06 ENCOUNTER — Encounter: Payer: Self-pay | Admitting: Medical

## 2022-11-15 ENCOUNTER — Telehealth: Payer: Self-pay | Admitting: Medical

## 2022-11-15 NOTE — Telephone Encounter (Signed)
Contacted Emily Cowan to schedule their annual wellness visit. Patient declined to schedule AWV at this time.  [facility] [employer] AWV direct phone # [phone]    Patient did not want to schedule awv for 2024.  She stated it was ok to call her back 2025

## 2022-11-29 ENCOUNTER — Other Ambulatory Visit: Payer: Medicare HMO

## 2022-12-04 ENCOUNTER — Encounter: Payer: Self-pay | Admitting: Family Medicine

## 2022-12-04 ENCOUNTER — Ambulatory Visit (INDEPENDENT_AMBULATORY_CARE_PROVIDER_SITE_OTHER): Payer: Medicare HMO | Admitting: Family Medicine

## 2022-12-04 VITALS — BP 130/74 | HR 83 | Temp 98.0°F | Resp 14 | Wt 163.3 lb

## 2022-12-04 DIAGNOSIS — N3000 Acute cystitis without hematuria: Secondary | ICD-10-CM

## 2022-12-04 DIAGNOSIS — R350 Frequency of micturition: Secondary | ICD-10-CM | POA: Diagnosis not present

## 2022-12-04 LAB — POCT URINALYSIS DIP (PROADVANTAGE DEVICE)
Bilirubin, UA: NEGATIVE
Blood, UA: NEGATIVE
Glucose, UA: NEGATIVE mg/dL
Ketones, POC UA: NEGATIVE mg/dL
Nitrite, UA: NEGATIVE
Protein Ur, POC: NEGATIVE mg/dL
Specific Gravity, Urine: 1.005
Urobilinogen, Ur: 0.2
pH, UA: 6 (ref 5.0–8.0)

## 2022-12-04 MED ORDER — SULFAMETHOXAZOLE-TRIMETHOPRIM 800-160 MG PO TABS: 1.0000 | ORAL_TABLET | Freq: Two times a day (BID) | ORAL | 0 refills | Status: DC

## 2022-12-04 NOTE — Progress Notes (Signed)
   Subjective:    Patient ID: Emily Cowan, female    DOB: 01/25/1946, 77 y.o.   MRN: [id number]  HPI She complains of a 4-day history of dysuria and most recently did have some vaginal itching.  No fever, chills or abdominal pain.   Review of Systems     Objective:   Physical Exam Alert and in no distress.  Urine microscopic showed WBCs TNTC.  No other cells seen.       Assessment & Plan:  Acute cystitis without hematuria - Plan: sulfamethoxazole-trimethoprim (BACTRIM DS) 800-160 MG tablet  Urinary frequency - Plan: POCT Urinalysis DIP (Proadvantage Device)

## 2022-12-10 ENCOUNTER — Other Ambulatory Visit: Payer: Self-pay | Admitting: Family Medicine

## 2022-12-10 DIAGNOSIS — N3000 Acute cystitis without hematuria: Secondary | ICD-10-CM

## 2022-12-11 MED ORDER — SULFAMETHOXAZOLE-TRIMETHOPRIM 800-160 MG PO TABS: 1.0000 | ORAL_TABLET | Freq: Two times a day (BID) | ORAL | 0 refills | Status: DC

## 2022-12-16 ENCOUNTER — Other Ambulatory Visit: Payer: Self-pay | Admitting: Medical

## 2022-12-24 ENCOUNTER — Other Ambulatory Visit: Payer: Self-pay | Admitting: Medical

## 2022-12-25 ENCOUNTER — Other Ambulatory Visit: Payer: Self-pay | Admitting: Medical

## 2022-12-25 MED ORDER — ZOLPIDEM TARTRATE 5 MG PO TABS: 5.0000 mg | ORAL_TABLET | Freq: Every evening | ORAL | 0 refills | Status: DC | PRN

## 2022-12-25 NOTE — Telephone Encounter (Signed)
From: Carmela Rima To: Office of Kristian Covey, [place] Sent: 12/25/2022 6:30 AM EDT Subject: Medication Renewal Request  Refills have been requested for the following medications:   zolpidem (AMBIEN) 5 MG tablet [Shane Olita Takeshita]  Preferred pharmacy: [facility] - [place], [place] - [address] Delivery method: Mail

## 2023-01-14 ENCOUNTER — Telehealth: Payer: Self-pay | Admitting: Medical

## 2023-01-14 NOTE — Telephone Encounter (Signed)
Pt called concerning next Prolia injection. Pt advised estimated cost is $317 and PA on filed and attached. Pt advised to call as leaving and she verbalized understanding. Medication ordered from [facility].

## 2023-01-17 ENCOUNTER — Other Ambulatory Visit: Payer: Medicare HMO

## 2023-01-17 DIAGNOSIS — M810 Age-related osteoporosis without current pathological fracture: Secondary | ICD-10-CM | POA: Diagnosis not present

## 2023-01-17 MED ORDER — DENOSUMAB 60 MG/ML ~~LOC~~ SOSY
60.0000 mg | PREFILLED_SYRINGE | Freq: Once | SUBCUTANEOUS | Status: AC
Start: 2023-01-17 — End: 2023-01-17
  Administered 2023-01-17: 60 mg via SUBCUTANEOUS

## 2023-03-04 ENCOUNTER — Other Ambulatory Visit: Payer: Self-pay | Admitting: Medical

## 2023-03-21 ENCOUNTER — Other Ambulatory Visit: Payer: Self-pay | Admitting: Medical

## 2023-03-21 NOTE — Telephone Encounter (Signed)
Refill request last apt 12/04/22. 

## 2023-04-03 ENCOUNTER — Encounter: Payer: Self-pay | Admitting: Family Medicine

## 2023-04-03 ENCOUNTER — Ambulatory Visit (HOSPITAL_COMMUNITY)
Admission: RE | Admit: 2023-04-03 | Discharge: 2023-04-03 | Disposition: A | Discharge status: Home / Self Care | Payer: Medicare HMO | Source: Ambulatory Visit | Attending: Family Medicine | Admitting: Family Medicine

## 2023-04-03 ENCOUNTER — Telehealth: Payer: Self-pay | Admitting: *Deleted

## 2023-04-03 ENCOUNTER — Ambulatory Visit (INDEPENDENT_AMBULATORY_CARE_PROVIDER_SITE_OTHER): Payer: Medicare HMO | Admitting: Family Medicine

## 2023-04-03 VITALS — BP 150/90 | HR 80 | Temp 98.3°F | Ht 68.0 in | Wt 160.0 lb

## 2023-04-03 DIAGNOSIS — R42 Dizziness and giddiness: Secondary | ICD-10-CM | POA: Insufficient documentation

## 2023-04-03 DIAGNOSIS — R030 Elevated blood-pressure reading, without diagnosis of hypertension: Secondary | ICD-10-CM | POA: Diagnosis not present

## 2023-04-03 DIAGNOSIS — R251 Tremor, unspecified: Secondary | ICD-10-CM | POA: Insufficient documentation

## 2023-04-03 LAB — COMPREHENSIVE METABOLIC PANEL
ALT: 22 IU/L (ref 0–32)
AST: 20 IU/L (ref 0–40)
Albumin: 4.5 g/dL (ref 3.8–4.8)
Alkaline Phosphatase: 56 IU/L (ref 44–121)
BUN/Creatinine Ratio: 16 (ref 12–28)
BUN: 12 mg/dL (ref 8–27)
Bilirubin Total: 0.5 mg/dL (ref 0.0–1.2)
CO2: 23 mmol/L (ref 20–29)
Calcium: 9 mg/dL (ref 8.7–10.3)
Chloride: 105 mmol/L (ref 96–106)
Creatinine, Ser: 0.75 mg/dL (ref 0.57–1.00)
Globulin, Total: 2.4 g/dL (ref 1.5–4.5)
Glucose: 93 mg/dL (ref 70–99)
Potassium: 4.6 mmol/L (ref 3.5–5.2)
Sodium: 137 mmol/L (ref 134–144)
Total Protein: 6.9 g/dL (ref 6.0–8.5)
eGFR: 82 mL/min/{1.73_m2} (ref >59)

## 2023-04-03 LAB — CBC WITH DIFFERENTIAL/PLATELET
Basophils Absolute: 0 10*3/uL (ref 0.0–0.2)
Basos: 1 %
EOS (ABSOLUTE): 0 10*3/uL (ref 0.0–0.4)
Eos: 1 %
Hematocrit: 40.5 % (ref 34.0–46.6)
Hemoglobin: 13.5 g/dL (ref 11.1–15.9)
Lymphocytes Absolute: 1.6 10*3/uL (ref 0.7–3.1)
Lymphs: 25 %
MCH: 31.7 pg (ref 26.6–33.0)
MCHC: 33.3 g/dL (ref 31.5–35.7)
MCV: 95 fL (ref 79–97)
Monocytes Absolute: 0.7 10*3/uL (ref 0.1–0.9)
Monocytes: 11 %
Neutrophils Absolute: 4.2 10*3/uL (ref 1.4–7.0)
Neutrophils: 62 %
Platelets: 295 10*3/uL (ref 150–450)
RBC: 4.26 x10E6/uL (ref 3.77–5.28)
RDW: 13.6 % (ref 11.7–15.4)
WBC: 6.6 10*3/uL (ref 3.4–10.8)

## 2023-04-03 NOTE — Telephone Encounter (Signed)
Called patient and went over all results with her. She was glad everything was negative. She said she is still lightheaded. She is drinking plenty of water. Is there anything else you recommend or anything she can take?

## 2023-04-03 NOTE — Progress Notes (Addendum)
Chief Complaint  Patient presents with   Dizziness    Was walking into [employer] yesterday and felt dizzy enough to cause her to start walking to the right. She thinks she may have an inner ear issue. She seems shaky (I've met her before when she saw Vincenza Hews, I don't recall her being that shaky). When she turns her head a certain way she gets dizzy or even when she moves her eyes a certain way.    While walking into [employer] THREE DAYS AGO (not yesterday, admits she lied to nurse), she started veering to the right. She angled herself to the wall, leaned against it for a few minutes (didn't fall).  She was able to walk through the store normally, without drifting/veering, but felt lightheaded.  She was not having any vertigo. No headache at the time.    Prior to this, while driving, she felt lightheaded, short-lived, lasted just a few seconds.  She subsequently went to [employer], drove home, felt fine.  She admits starting an old z-pak on her own that day, thinking it was an inner ear infection. She is on day 3. She denies any ear pain, vertigo, but was concerned the LH/dizziness could be related to her ear.  Yesterday she had persistent LH/dizziness. She is supposed to go to [place] today, but was afraid to drive. "I'm not feeling right" today--a little LH with looking side to side. She feels shaky, unsure if related to being worried.  This is worse than her usual tremor. Denies any potential for alcohol withdrawal--she has been having her usual 2 glasses of wine/night.  She has h/o vertigo in the past--two episodes, one was more severe than the others.  She didn't have any spinning the other day, just the trouble walking and LH.  She noticed increased lightheadedness with change in head position. She has some chronic neck discomfort, not any worse than usual, wonders if these are related.  She thinks she may be dehydrated--doesn't drink enough water. Urine has been dark.  She has some issues  with wax in her ears, admits to sticking a toothpick and towel in ear.  No h/o HTN.  BP Readings from Last 3 Encounters:  04/03/23 (!) 170/100  12/04/22 130/74  10/18/22 134/80      PMH, PSH, SH reviewed Shane pt.  CPE 09/2022. Labs from 09/2022, reviewed, normal H/o depression, on prozac H/o breast CA, alcohol (cut back to 2/night), osteoporosis  Outpatient Encounter Medications as of 04/03/2023  Medication Sig Note   Ascorbic Acid (VITAMIN C) 500 MG CAPS     azithromycin (ZITHROMAX) 250 MG tablet Take 250 mg by mouth daily. 04/03/2023: Day 3 (expired)   FLUoxetine (PROZAC) 40 MG capsule TAKE 1 CAPSULE EVERY DAY    Glucosamine-Chondroit-Vit C-Mn (GLUCOSAMINE CHONDR 1500 COMPLX) CAPS Take 1 capsule by mouth daily.    Magnesium 250 MG TABS Take 1 tablet by mouth at bedtime.    Multiple Vitamins-Minerals (MULTIVITAMIN WITH MINERALS) tablet Take 1 tablet by mouth daily.      omeprazole (PRILOSEC) 40 MG capsule TAKE 1 CAPSULE EVERY DAY    rosuvastatin (CRESTOR) 10 MG tablet TAKE 1 TABLET AT BEDTIME    zolpidem (AMBIEN) 5 MG tablet TAKE 1 TABLET AT BEDTIME AS NEEDED FOR SLEEP 04/03/2023: Took one last night   [DISCONTINUED] sulfamethoxazole-trimethoprim (BACTRIM DS) 800-160 MG tablet Take 1 tablet by mouth 2 (two) times daily.    No facility-administered encounter medications on file as of 04/03/2023.   Allergies  Allergen  Reactions   Sulfa Antibiotics Nausea And Vomiting    ROS: no fever, chills.  +LH, balance issue per HPI. No ear pain, congestion, allergies are not flaring, no sinus pain, cough, sore throat. No chest pain, palpitations, shortness of breath, edema. No n/v/d, bowel changes or heartburn. No urinary symptoms. Denies any bleeding.    PHYSICAL EXAM:  BP (!) 170/100   Pulse 84   Temp 98.3 F (36.8 C) (Tympanic)   Ht 5\' 8"  (1.727 m)   Wt 160 lb (72.6 kg)   BMI 24.33 kg/m   170/90 on repeat by MD  HEENT: conjunctiva and sclera are clear, EOMI. TM's and EAC's  normal, no cerumen. Nodule on tip of tongue on the left (unchanged per pt). OP otherwise clear.  Lips are a little dry, mucus membranes are moist. Somewhat tremulous, intermittently--affecting head, whole upper body, and sometimes lower. Fluctuates during visit. More when anxious, and with intention. Neck: Some kyphosis and limited neck extension.. no focal tenderness, lymphadenopathy or mass Heart: regular rate and rhythm no murmur Lungs: clear bilaterally Back: no spinal or CVA tenderness Abdomen: soft, nontender, no mass Extremities: no edema, normal pulses Neuro: slightly anxious, tremulous as reported above.   Cranial nerves 2-12 intact. Normal strength, sensation. DTR's 3+ and symmetric (always, per pt). Normal finger to nose.  Negative Romberg. Some trouble with tandem gait, slight fall to the LEFT. Alert and oriented. Some vertigo with head turned to the left when laying down.  This was NOT reproducible--she thinks it was related to depth perception and appearance of the wall. When repeated with eyes closed, she did NOT have vertigo looking to the left. No vertigo with other position changes.   ASSESSMENT/PLAN:  Dizziness - Plan: CT HEAD WO CONTRAST ( ), CBC with Differential/Platelet, Comprehensive metabolic panel  Elevated blood pressure reading - Plan: Comprehensive metabolic panel  Tremulousness - Plan: CT HEAD WO CONTRAST ( ), CBC with Differential/Platelet, Comprehensive metabolic panel  Loss of equilibrium - Plan: CT HEAD WO CONTRAST ( )  Lightheaded - Plan: CBC with Differential/Platelet, Comprehensive metabolic panel  77 yo fairly healthy patient, now with high blood pressure, lightheadedness, episodes of equilibrium issues. Rec eval with CT and stat labs (vs going to ER). Given no major acute changes on neuro exam, will hold off on ER (will send if CT abnormal); pt understands to go to ER if symptoms worsen prior to CT.   ADDENDUM-- CBC and c-met were  fine. No acute abnls on head CT; some asymmetry of atrophy noted. Encouraged to hydrate, rest, and pay attention to symptoms. To f/u if persists/worsens. Rec f/u within the next 1-2 weeks with Vincenza Hews (to f/u on BP, ensure symptoms all resolved).

## 2023-04-03 NOTE — Telephone Encounter (Signed)
Her BP was high, so she needs to be sure to limit the sodium in her diet. If she has a way to monitor her BP, that would be good (to be sure it doesn't stay high). She needs to drink at least 6-8 glasses of water, and look for her urine to be very pale yellow (or clear).  If she does notice equilibrium or vertigo issues, she can try some meclizine 12.5mg .  She may want to follow-up with Vincenza Hews next week to recheck her BP and ensure that she is getting back to normal.

## 2023-04-03 NOTE — Telephone Encounter (Signed)
Patient advised of all recommendations.  

## 2023-04-18 ENCOUNTER — Ambulatory Visit (INDEPENDENT_AMBULATORY_CARE_PROVIDER_SITE_OTHER): Payer: Medicare HMO | Admitting: Medical

## 2023-04-18 VITALS — BP 130/84 | HR 105 | Wt 160.6 lb

## 2023-04-18 DIAGNOSIS — Z789 Other specified health status: Secondary | ICD-10-CM

## 2023-04-18 DIAGNOSIS — F5104 Psychophysiologic insomnia: Secondary | ICD-10-CM

## 2023-04-18 DIAGNOSIS — G250 Essential tremor: Secondary | ICD-10-CM

## 2023-04-18 DIAGNOSIS — F1090 Alcohol use, unspecified, uncomplicated: Secondary | ICD-10-CM

## 2023-04-18 DIAGNOSIS — E782 Mixed hyperlipidemia: Secondary | ICD-10-CM

## 2023-04-18 DIAGNOSIS — Z79899 Other long term (current) drug therapy: Secondary | ICD-10-CM | POA: Diagnosis not present

## 2023-04-18 DIAGNOSIS — Z974 Presence of external hearing-aid: Secondary | ICD-10-CM

## 2023-04-18 DIAGNOSIS — R42 Dizziness and giddiness: Secondary | ICD-10-CM

## 2023-04-18 NOTE — Patient Instructions (Signed)
Dizziness I reviewed her recent visit notes here including CT head and labs.  We discussed the atrophy and findings on the scan.  Fortunately no stroke or bleed or hydrocephalus. She has meclizine at home she can use the next time she has vertigo symptoms Continue to drink 80 to 100 ounces of water daily Consider checking your blood sugar if you feel weak or dizzy in the future.  Blood sugar less than 70 would be abnormal.  Similarly consider checking your blood pressure to make sure you are within normal limits.  Blood pressure should be around 120/70.  Anything lower than 105/60 would be low that could potentially cause her symptoms  I recommend you limit your alcohol to 1 glass of wine or less per night  Hyperlipidemia-continue your cholesterol medicine as usual  At your neurology consult 2 years ago, it was felt that Ambien and alcohol consumption daily particularly together is not the best plan.  I recommend you limit the alcohol and consider may be trial of a different medicine for sleep.  The goal is to avoid falls and to not see your tremor get worse over time  Consider taking a daily multivitamin if you are not doing so.  Consider B vitamin supplement such as complex B vitamins  Consider getting an over-the-counter blood pressure cuff to monitor blood pressure periodically or anytime you do not feel right

## 2023-04-18 NOTE — Progress Notes (Signed)
Subjective:  Emily Cowan is a 77 y.o. female who presents for Chief Complaint  Patient presents with   Medical Management of Chronic Issues    Follow-up on BP     Here for med check follow-up.  She was seen here earlier this month for dizziness.  She saw Dr. Lynelle Doctor here.  Given the symptoms and concerns, CT head was ordered as well as labs.   She notes that after that visit she has been fine.   She did buy meclizine to have on hand for vertigo.  Hyperlipidemia-compliant with Crestor without complaint  Insomnia-she uses Ambien long-term, does fine on this.  She continues on Prozac 40 mg ongoing for history of depression.  No worse essential tremor.  She saw neurology back in 2022 for essential tremor.  She eats 3 times daily.  She does continue drinking wine nightly, typically 1-2 glasses of wine.  No other aggravating or relieving factors.    No other c/o.  Past Medical History:  Diagnosis Date   Alcohol abuse    Allergic rhinitis    Allergy    seasonal   Arthritis    Cancer (HCC) 2002   BREAST   Depression    Family history of cancer    multiple   Hearing impaired    wears hearing aids   Hemangioma of liver 2005   MRI    Osteoporosis    Seborrheic dermatitis    Vulvodynia    longstanding, failed Premarin cream; can't tolerate pelvic exams   Wears glasses    Current Outpatient Medications on File Prior to Visit  Medication Sig Dispense Refill   Ascorbic Acid (VITAMIN C) 500 MG CAPS      FLUoxetine (PROZAC) 40 MG capsule TAKE 1 CAPSULE EVERY DAY 90 capsule 0   Glucosamine-Chondroit-Vit C-Mn (GLUCOSAMINE CHONDR 1500 COMPLX) CAPS Take 1 capsule by mouth daily.     Magnesium 250 MG TABS Take 1 tablet by mouth at bedtime.     Multiple Vitamins-Minerals (MULTIVITAMIN WITH MINERALS) tablet Take 1 tablet by mouth daily.       omeprazole (PRILOSEC) 40 MG capsule TAKE 1 CAPSULE EVERY DAY 90 capsule 3   rosuvastatin (CRESTOR) 10 MG tablet TAKE 1 TABLET AT BEDTIME  90 tablet 3   zolpidem (AMBIEN) 5 MG tablet TAKE 1 TABLET AT BEDTIME AS NEEDED FOR SLEEP 90 tablet 0   No current facility-administered medications on file prior to visit.    The following portions of the patient's history were reviewed and updated as appropriate: allergies, current medications, past family history, past medical history, past social history, past surgical history and problem list.  ROS Otherwise as in subjective above   Objective: BP 130/84   Pulse (!) 105   Wt 160 lb 9.6 oz (72.8 kg)   BMI 24.42 kg/m   Wt Readings from Last 3 Encounters:  04/18/23 160 lb 9.6 oz (72.8 kg)  04/03/23 160 lb (72.6 kg)  12/04/22 163 lb 4.8 oz (74.1 kg)   BP Readings from Last 3 Encounters:  04/18/23 130/84  04/03/23 (!) 150/90  12/04/22 130/74   General appearance: alert, no distress, well developed, well nourished HEENT: normocephalic, sclerae anicteric, conjunctiva pink and moist, TMs pearly, nares patent, no discharge or erythema, pharynx normal Oral cavity: MMM, no lesions Neck: supple, no lymphadenopathy, no thyromegaly, no masses Heart: RRR, normal S1, S2, no murmurs Lungs: CTA bilaterally, no wheezes, rhonchi, or rales Pulses: 2+ radial pulses, 2+ pedal pulses, normal cap refill Ext:  no edema Neuro: Obvious essential tremor, motion tremor, otherwise negative Romberg, normal CN II through XII intact, nonfocal exam otherwise   Assessment: Encounter Diagnoses  Name Primary?   Dizziness Yes   Mixed hyperlipidemia    Wears hearing aid    Alcohol use    Chronic insomnia    High risk medication use    Essential tremor      Plan: Dizziness I reviewed her recent visit notes here including CT head and labs.  We discussed the atrophy and findings on the scan.  Fortunately no stroke or bleed or hydrocephalus. She has meclizine at home she can use the next time she has vertigo symptoms Continue to drink 80 to 100 ounces of water daily Consider checking your blood sugar  if you feel weak or dizzy in the future.  Blood sugar less than 70 would be abnormal.  Similarly consider checking your blood pressure to make sure you are within normal limits.  Blood pressure should be around 120/70.  Anything lower than 105/60 would be low that could potentially cause her symptoms  I recommend you limit your alcohol to 1 glass of wine or less per night  Hyperlipidemia-continue your cholesterol medicine as usual  At your neurology consult 2 years ago, it was felt that Ambien and alcohol consumption daily particularly together is not the best plan.  I recommend you limit the alcohol and consider may be trial of a different medicine for sleep.  The goal is to avoid falls and to not see your tremor get worse over time  Consider taking a daily multivitamin if you are not doing so.  Consider B vitamin supplement such as complex B vitamins  Consider getting an over-the-counter blood pressure cuff to monitor blood pressure periodically or anytime you do not feel right  Sincere "[name]" was seen today for medical management of chronic issues.  Diagnoses and all orders for this visit:  Dizziness  Mixed hyperlipidemia  Wears hearing aid  Alcohol use  Chronic insomnia  High risk medication use  Essential tremor    Follow up: yearly for well visit

## 2023-04-19 MED ORDER — BLOOD GLUCOSE MONITORING SUPPL DEVI: 0 refills | Status: AC

## 2023-04-19 MED ORDER — LANCET DEVICE MISC: 0 refills | Status: AC

## 2023-04-19 MED ORDER — LANCETS MISC. MISC: 0 refills | Status: AC

## 2023-04-19 MED ORDER — BLOOD GLUCOSE TEST VI STRP: ORAL_STRIP | 0 refills | Status: AC

## 2023-04-26 ENCOUNTER — Other Ambulatory Visit: Payer: Self-pay

## 2023-04-26 ENCOUNTER — Emergency Department (HOSPITAL_COMMUNITY): Payer: Medicare HMO

## 2023-04-26 ENCOUNTER — Encounter (HOSPITAL_COMMUNITY): Payer: Self-pay

## 2023-04-26 ENCOUNTER — Emergency Department (HOSPITAL_COMMUNITY)
Admission: EM | Admit: 2023-04-26 | Discharge: 2023-04-26 | Disposition: A | Discharge status: Home / Self Care | Payer: Medicare HMO | Source: Home / Self Care | Attending: Emergency Medicine | Admitting: Emergency Medicine

## 2023-04-26 DIAGNOSIS — S8992XA Unspecified injury of left lower leg, initial encounter: Secondary | ICD-10-CM | POA: Diagnosis not present

## 2023-04-26 DIAGNOSIS — S82202A Unspecified fracture of shaft of left tibia, initial encounter for closed fracture: Secondary | ICD-10-CM | POA: Diagnosis not present

## 2023-04-26 DIAGNOSIS — X501XXA Overexertion from prolonged static or awkward postures, initial encounter: Secondary | ICD-10-CM | POA: Diagnosis not present

## 2023-04-26 DIAGNOSIS — I10 Essential (primary) hypertension: Secondary | ICD-10-CM | POA: Diagnosis not present

## 2023-04-26 DIAGNOSIS — M25462 Effusion, left knee: Secondary | ICD-10-CM | POA: Diagnosis not present

## 2023-04-26 DIAGNOSIS — W19XXXA Unspecified fall, initial encounter: Secondary | ICD-10-CM | POA: Diagnosis not present

## 2023-04-26 DIAGNOSIS — S82102A Unspecified fracture of upper end of left tibia, initial encounter for closed fracture: Secondary | ICD-10-CM | POA: Insufficient documentation

## 2023-04-26 DIAGNOSIS — Z87891 Personal history of nicotine dependence: Secondary | ICD-10-CM | POA: Insufficient documentation

## 2023-04-26 DIAGNOSIS — Z853 Personal history of malignant neoplasm of breast: Secondary | ICD-10-CM | POA: Diagnosis not present

## 2023-04-26 DIAGNOSIS — S82142A Displaced bicondylar fracture of left tibia, initial encounter for closed fracture: Secondary | ICD-10-CM | POA: Diagnosis not present

## 2023-04-26 LAB — CBC WITH DIFFERENTIAL/PLATELET
Abs Immature Granulocytes: 0.04 10*3/uL (ref 0.00–0.07)
Basophils Absolute: 0 10*3/uL (ref 0.0–0.1)
Basophils Relative: 0 %
Eosinophils Absolute: 0 10*3/uL (ref 0.0–0.5)
Eosinophils Relative: 0 %
HCT: 37.7 % (ref 36.0–46.0)
Hemoglobin: 12.8 g/dL (ref 12.0–15.0)
Immature Granulocytes: 0 %
Lymphocytes Relative: 11 %
Lymphs Abs: 1 10*3/uL (ref 0.7–4.0)
MCH: 32.2 pg (ref 26.0–34.0)
MCHC: 34 g/dL (ref 30.0–36.0)
MCV: 95 fL (ref 80.0–100.0)
Monocytes Absolute: 0.6 10*3/uL (ref 0.1–1.0)
Monocytes Relative: 6 %
Neutro Abs: 8 10*3/uL — ABNORMAL HIGH (ref 1.7–7.7)
Neutrophils Relative %: 83 %
Platelets: 240 10*3/uL (ref 150–400)
RBC: 3.97 MIL/uL (ref 3.87–5.11)
RDW: 12.9 % (ref 11.5–15.5)
WBC: 9.8 10*3/uL (ref 4.0–10.5)
nRBC: 0 % (ref 0.0–0.2)

## 2023-04-26 LAB — BASIC METABOLIC PANEL
Anion gap: 8 (ref 5–15)
BUN: 12 mg/dL (ref 8–23)
CO2: 21 mmol/L — ABNORMAL LOW (ref 22–32)
Calcium: 8.6 mg/dL — ABNORMAL LOW (ref 8.9–10.3)
Chloride: 109 mmol/L (ref 98–111)
Creatinine, Ser: 0.83 mg/dL (ref 0.44–1.00)
GFR, Estimated: >60 mL/min (ref >60)
Glucose, Bld: 110 mg/dL — ABNORMAL HIGH (ref 70–99)
Potassium: 3.9 mmol/L (ref 3.5–5.1)
Sodium: 138 mmol/L (ref 135–145)

## 2023-04-26 MED ORDER — HYDROCODONE-ACETAMINOPHEN 5-325 MG PO TABS
1.0000 | ORAL_TABLET | Freq: Once | ORAL | Status: AC
  Administered 2023-04-26: 1 via ORAL
  Filled 2023-04-26: qty 1

## 2023-04-26 MED ORDER — HYDROMORPHONE HCL 1 MG/ML IJ SOLN
1.0000 mg | Freq: Once | INTRAMUSCULAR | Status: AC
  Administered 2023-04-26: 1 mg via INTRAVENOUS
  Filled 2023-04-26: qty 1

## 2023-04-26 MED ORDER — ACETAMINOPHEN 325 MG PO TABS: 650.0000 mg | ORAL_TABLET | Freq: Four times a day (QID) | ORAL | 0 refills | Status: AC | PRN

## 2023-04-26 MED ORDER — OXYCODONE HCL 5 MG PO TABS
5.0000 mg | ORAL_TABLET | ORAL | 0 refills | Status: DC | PRN
Start: 2023-04-26 — End: 2024-07-02

## 2023-04-26 MED ORDER — ONDANSETRON HCL 4 MG/2ML IJ SOLN
4.0000 mg | Freq: Once | INTRAMUSCULAR | Status: AC
  Administered 2023-04-26: 4 mg via INTRAVENOUS
  Filled 2023-04-26: qty 2

## 2023-04-26 MED ORDER — SODIUM CHLORIDE 0.9 % IV BOLUS
500.0000 mL | Freq: Once | INTRAVENOUS | Status: AC
  Administered 2023-04-26: 500 mL via INTRAVENOUS

## 2023-04-26 NOTE — ED Notes (Signed)
Ortho tech called regarding knee immobilizer.

## 2023-04-26 NOTE — ED Triage Notes (Signed)
Per EMS, Pt, from home, c/o L knee pain/injury after being knocked down by her dog this morning.  Pain score 7/10.  Pt has been unable to bear weight on leg since fall.  Denies hitting head and LOC.    Pt given 30mg  Ketamine en route.

## 2023-04-26 NOTE — ED Notes (Signed)
Pt verbalized understanding of discharge instructions. Opportunity for questions provided.  Attempted to education Pt on using crutches.  Pt walked several steps x2.

## 2023-04-26 NOTE — ED Notes (Signed)
Ortho tech sts she will notate the crutches on the adapt health sheet from the knee immobilizer.

## 2023-04-26 NOTE — ED Notes (Signed)
Per Ortho PA, Pt can have a drink.

## 2023-04-26 NOTE — ED Notes (Signed)
Patient transported to CT 

## 2023-04-26 NOTE — Consult Note (Signed)
Reason for Consult:Left tibia plateau fx Referring Physician: Tanda Rockers Time called: 1159 Time at bedside: 1311   Emily Cowan is an 77 y.o. female.  HPI: Emily Cowan was knocked over by her dog earlier today. She had immediate left knee pain and could not get up or bear weight. She was brought to the ED where x-rays showed a tibia plateau fx and orthopedic surgery was consulted. She lives at home alone, is retired, and does not use any assistive devices to ambulate.  Past Medical History:  Diagnosis Date   Alcohol abuse    Allergic rhinitis    Allergy    seasonal   Arthritis    Cancer (HCC) 2002   BREAST   Depression    Family history of cancer    multiple   Hearing impaired    wears hearing aids   Hemangioma of liver 2005   MRI    Osteoporosis    Seborrheic dermatitis    Vulvodynia    longstanding, failed Premarin cream; can't tolerate pelvic exams   Wears glasses     Past Surgical History:  Procedure Laterality Date   cataract surgery     COLONOSCOPY  2011   in [place]   COLONOSCOPY  2018   MASTECTOMY  2002   right   TONSILLECTOMY     WISDOM TOOTH EXTRACTION      Family History  Problem Relation Age of Onset   Cancer Mother        GI cancer   Cancer Father        liver   Stroke Father    Cancer Sister        vulvar   Rectal cancer Sister 59   Cancer Brother        liver   Colon polyps Brother 28   Cancer Brother        lung   Diabetes Neg Hx    Heart disease Neg Hx    Hypertension Neg Hx    Hyperlipidemia Neg Hx    Breast cancer Neg Hx    Hemochromatosis Neg Hx    Stomach cancer Neg Hx    Esophageal cancer Neg Hx    Tremor Neg Hx     Social History:  reports that she quit smoking about 32 years ago. Her smoking use included cigarettes. She has never used smokeless tobacco. She reports current alcohol use of about 5.0 - 6.0 standard drinks of alcohol per week. She reports that she does not use drugs.  Allergies:  Allergies  Allergen  Reactions   Sulfa Antibiotics Nausea And Vomiting    Medications: I have reviewed the patient's current medications.  Results for orders placed or performed during the hospital encounter of 04/26/23 (from the past 48 hour(s))  CBC with Differential     Status: Abnormal   Collection Time: 04/26/23 10:52 AM  Result Value Ref Range   WBC 9.8 4.0 - 10.5 K/uL   RBC 3.97 3.87 - 5.11 MIL/uL   Hemoglobin 12.8 12.0 - 15.0 g/dL   HCT 29.5 62.1 - 30.8 %   MCV 95.0 80.0 - 100.0 fL   MCH 32.2 26.0 - 34.0 pg   MCHC 34.0 30.0 - 36.0 g/dL   RDW 65.7 84.6 - 96.2 %   Platelets 240 150 - 400 K/uL   nRBC 0.0 0.0 - 0.2 %   Neutrophils Relative % 83 %   Neutro Abs 8.0 (H) 1.7 - 7.7 K/uL   Lymphocytes Relative 11 %  Lymphs Abs 1.0 0.7 - 4.0 K/uL   Monocytes Relative 6 %   Monocytes Absolute 0.6 0.1 - 1.0 K/uL   Eosinophils Relative 0 %   Eosinophils Absolute 0.0 0.0 - 0.5 K/uL   Basophils Relative 0 %   Basophils Absolute 0.0 0.0 - 0.1 K/uL   Immature Granulocytes 0 %   Abs Immature Granulocytes 0.04 0.00 - 0.07 K/uL    Comment: Performed at [facility], [address], [place], [place] [address]  Basic metabolic panel     Status: Abnormal   Collection Time: 04/26/23 10:52 AM  Result Value Ref Range   Sodium 138 135 - 145 mmol/L   Potassium 3.9 3.5 - 5.1 mmol/L   Chloride 109 98 - 111 mmol/L   CO2 21 (L) 22 - 32 mmol/L   Glucose, Bld 110 (H) 70 - 99 mg/dL    Comment: Glucose reference range applies only to samples taken after fasting for at least 8 hours.   BUN 12 8 - 23 mg/dL   Creatinine, Ser 6.04 0.44 - 1.00 mg/dL   Calcium 8.6 (L) 8.9 - 10.3 mg/dL   GFR, Estimated >54 >09 mL/min    Comment: (NOTE) Calculated using the CKD-EPI Creatinine Equation (2021)    Anion gap 8 5 - 15    Comment: Performed at [facility], [address], [place], [place] [address]    DG Knee Complete 4 Views Left  Result Date: 04/26/2023 CLINICAL DATA:  Infrapatellar pain after a fall. EXAM:  LEFT KNEE - COMPLETE 4 VIEW COMPARISON:  None available FINDINGS: A nondisplaced fracture is present in the proximal tibial metaphysis. The fracture extends to the posterior aspect of the tibial plateau. No significant depression is present. No foreign body is present. A moderate joint effusion is present. The patella is intact. IMPRESSION: Nondisplaced fracture of the proximal tibial metaphysis extending to the posterior aspect of the tibial plateau. Electronically Signed   By: Marin Roberts M.D.   On: 04/26/2023 11:41    Review of Systems  HENT:  Negative for ear discharge, ear pain, hearing loss and tinnitus.   Eyes:  Negative for photophobia and pain.  Respiratory:  Negative for cough and shortness of breath.   Cardiovascular:  Negative for chest pain.  Gastrointestinal:  Negative for abdominal pain, nausea and vomiting.  Genitourinary:  Negative for dysuria, flank pain, frequency and urgency.  Musculoskeletal:  Positive for arthralgias (Left knee). Negative for back pain, myalgias and neck pain.  Neurological:  Negative for dizziness and headaches.  Hematological:  Does not bruise/bleed easily.  Psychiatric/Behavioral:  The patient is not nervous/anxious.    Blood pressure (!) 156/90, pulse 77, temperature 98.1 F (36.7 C), temperature source Oral, resp. rate 16, weight 72.6 kg, SpO2 97%. Physical Exam Constitutional:      General: She is not in acute distress.    Appearance: She is well-developed. She is not diaphoretic.  HENT:     Head: Normocephalic and atraumatic.  Eyes:     General: No scleral icterus.       Right eye: No discharge.        Left eye: No discharge.     Conjunctiva/sclera: Conjunctivae normal.  Cardiovascular:     Rate and Rhythm: Normal rate and regular rhythm.  Pulmonary:     Effort: Pulmonary effort is normal. No respiratory distress.  Musculoskeletal:     Cervical back: Normal range of motion.     Comments: LLE No traumatic wounds,  ecchymosis, or  rash  Mod TTP knee  No ankle effusion  Sens DPN, SPN, TN intact  Motor EHL, ext, flex, evers 5/5  DP 2+, PT 1+, No significant edema  Skin:    General: Skin is warm and dry.  Neurological:     Mental Status: She is alert.  Psychiatric:        Mood and Affect: Mood normal.        Behavior: Behavior normal.     Assessment/Plan: Left tibia plateau fx -- Plan initial non-operative treatment in [facility] and [facility]. F/u with Dr. Jena Gauss on Tuesday.    Freeman Caldron, PA-C Orthopedic Surgery [phone] 04/26/2023, 1:28 PM

## 2023-04-26 NOTE — Discharge Instructions (Signed)
It was a pleasure caring for you today in the emergency department.  Please return to the emergency department for any worsening or worrisome symptoms.  Please follow up with Dr Jena Gauss on TUESDAY  Non weight bearing to left leg until cleared by orthopedics

## 2023-04-26 NOTE — ED Provider Notes (Signed)
[place] EMERGENCY DEPARTMENT AT [facility] Provider Note  CSN: [id number] Arrival date & time: 04/26/23 1013  Chief Complaint(s) Fall and Leg Injury  HPI Emily Cowan is a 77 y.o. female with past medical history as below, significant for depression, alcohol use, HLD, DDD who presents to the ED with complaint of fall, left knee injury  Patient reports this morning her dog jumped on her and knocked her to the ground, she felt back and twisted her left knee.  Severe pain to left knee, unable to weight-bear.  No head injury or LOC.  No numbness or tingling to her affected extremity.  Pain primarily localized to area inferior to her patella on the left.  Past Medical History Past Medical History:  Diagnosis Date   Alcohol abuse    Allergic rhinitis    Allergy    seasonal   Arthritis    Cancer (HCC) 2002   BREAST   Depression    Family history of cancer    multiple   Hearing impaired    wears hearing aids   Hemangioma of liver 2005   MRI    Osteoporosis    Seborrheic dermatitis    Vulvodynia    longstanding, failed Premarin cream; can't tolerate pelvic exams   Wears glasses    Patient Active Problem List   Diagnosis Date Noted   Essential tremor 04/18/2023   Dizziness 04/18/2023   Mild cognitive impairment 10/18/2022   Tremor, physiological 01/18/2021   Alcohol use 01/18/2021   Chronic insomnia 01/18/2021   Advanced directives, counseling/discussion 10/05/2020   Tubular adenoma 03/04/2020   Encounter for screening mammogram for malignant neoplasm of breast 03/04/2020   High risk medication use 03/04/2020   Wears hearing aid 06/25/2019   Gastroesophageal reflux disease without esophagitis 06/20/2017   Vitamin D deficiency 06/19/2016   Vaccine counseling 06/19/2016   Encounter for health maintenance examination in adult 06/19/2016   Hyperlipidemia 06/19/2016   Estrogen deficiency 06/19/2016   History of alcohol abuse 06/01/2015   DDD  (degenerative disc disease), cervical 06/01/2015   Osteoporosis 08/13/2012   History of breast cancer in female 05/02/2011   Allergic rhinitis due to pollen 05/02/2011   Dysthymia 05/02/2011   Home Medication(s) Prior to Admission medications   Medication Sig Start Date End Date Taking? Authorizing Provider  acetaminophen (TYLENOL) 325 MG tablet Take 2 tablets (650 mg total) by mouth every 6 (six) hours as needed. 04/26/23  Yes Tanda Rockers A, DO  oxyCODONE (ROXICODONE) 5 MG immediate release tablet Take 1 tablet (5 mg total) by mouth every 4 (four) hours as needed for severe pain. 04/26/23  Yes Sloan Leiter, DO  Ascorbic Acid (VITAMIN C) 500 MG CAPS  01/20/19   [provider]  Blood Glucose Monitoring Suppl DEVI Test 1-2 times day. Pend on Insurance 04/19/23   Tysinger, Kermit Balo, PA-C  FLUoxetine (PROZAC) 40 MG capsule TAKE 1 CAPSULE EVERY DAY 03/21/23   Ronnald Nian, MD  Glucosamine-Chondroit-Vit C-Mn (GLUCOSAMINE CHONDR 1500 COMPLX) CAPS Take 1 capsule by mouth daily.    [provider]  Glucose Blood (BLOOD GLUCOSE TEST STRIPS) STRP Test 1-2 times daily 04/19/23   Jac Canavan, PA-C  Lancet Device MISC 1-2 times daily 04/19/23   Tysinger, Kermit Balo, PA-C  Lancets Misc. MISC 1-2 times a day 04/19/23   Tysinger, Kermit Balo, PA-C  Magnesium 250 MG TABS Take 1 tablet by mouth at bedtime.    [provider]  Multiple Vitamins-Minerals (MULTIVITAMIN  WITH MINERALS) tablet Take 1 tablet by mouth daily.      [provider]  omeprazole (PRILOSEC) 40 MG capsule TAKE 1 CAPSULE EVERY DAY 12/17/22   Tysinger, Kermit Balo, PA-C  rosuvastatin (CRESTOR) 10 MG tablet TAKE 1 TABLET AT BEDTIME 12/17/22   Tysinger, Kermit Balo, PA-C  zolpidem (AMBIEN) 5 MG tablet TAKE 1 TABLET AT BEDTIME AS NEEDED FOR SLEEP 03/04/23   Tysinger, Kermit Balo, PA-C                                                                                                                                    Past Surgical  History Past Surgical History:  Procedure Laterality Date   cataract surgery     COLONOSCOPY  2011   in [place]   COLONOSCOPY  2018   MASTECTOMY  2002   right   TONSILLECTOMY     WISDOM TOOTH EXTRACTION     Family History Family History  Problem Relation Age of Onset   Cancer Mother        GI cancer   Cancer Father        liver   Stroke Father    Cancer Sister        vulvar   Rectal cancer Sister 23   Cancer Brother        liver   Colon polyps Brother 82   Cancer Brother        lung   Diabetes Neg Hx    Heart disease Neg Hx    Hypertension Neg Hx    Hyperlipidemia Neg Hx    Breast cancer Neg Hx    Hemochromatosis Neg Hx    Stomach cancer Neg Hx    Esophageal cancer Neg Hx    Tremor Neg Hx     Social History Social History   Tobacco Use   Smoking status: Former    Current packs/day: 0.00    Types: Cigarettes    Quit date: 1992    Years since quitting: 32.6   Smokeless tobacco: Never  Vaping Use   Vaping status: Never Used  Substance Use Topics   Alcohol use: Yes    Alcohol/week: 5.0 - 6.0 standard drinks of alcohol    Types: 5 - 6 Glasses of wine per week    Comment: 2 glass of wine nightly (03/2023)   Drug use: No   Allergies Sulfa antibiotics  Review of Systems Review of Systems  Respiratory:  Negative for chest tightness and shortness of breath.   Gastrointestinal:  Negative for abdominal pain, nausea and vomiting.  Musculoskeletal:  Positive for arthralgias and gait problem.  Neurological:  Negative for syncope and headaches.  All other systems reviewed and are negative.   Physical Exam Vital Signs  I have reviewed the triage vital signs BP (!) 164/100   Pulse 95   Temp 98.5 F (36.9 C) (Oral)   Resp 16  Wt 72.6 kg   SpO2 94%   BMI 24.33 kg/m  Physical Exam Vitals and nursing note reviewed.  Constitutional:      General: She is not in acute distress.    Appearance: Normal appearance. She is not ill-appearing or diaphoretic.   HENT:     Head: Normocephalic and atraumatic.     Right Ear: External ear normal.     Left Ear: External ear normal.     Nose: Nose normal.     Mouth/Throat:     Mouth: Mucous membranes are moist.  Eyes:     General: No scleral icterus.       Right eye: No discharge.        Left eye: No discharge.  Cardiovascular:     Rate and Rhythm: Normal rate and regular rhythm.     Pulses: Normal pulses.     Heart sounds: Normal heart sounds.  Pulmonary:     Effort: Pulmonary effort is normal. No respiratory distress.     Breath sounds: Normal breath sounds. No stridor.  Abdominal:     General: Abdomen is flat. There is no distension.     Palpations: Abdomen is soft.     Tenderness: There is no abdominal tenderness.  Musculoskeletal:     Cervical back: No rigidity.     Right lower leg: No edema.     Left lower leg: No edema.       Legs:     Comments: Severe pain infrapatellar, popliteal fossa LE NVI   Skin:    General: Skin is warm and dry.     Capillary Refill: Capillary refill takes less than 2 seconds.  Neurological:     Mental Status: She is alert.  Psychiatric:        Mood and Affect: Mood normal.        Behavior: Behavior normal. Behavior is cooperative.     ED Results and Treatments Labs (all labs ordered are listed, but only abnormal results are displayed) Labs Reviewed  CBC WITH DIFFERENTIAL/PLATELET - Abnormal; Notable for the following components:      Result Value   Neutro Abs 8.0 (*)    All other components within normal limits  BASIC METABOLIC PANEL - Abnormal; Notable for the following components:   CO2 21 (*)    Glucose, Bld 110 (*)    Calcium 8.6 (*)    All other components within normal limits                                                                                                                          Radiology CT Knee Left Wo Contrast  Result Date: 04/26/2023 CLINICAL DATA:  Knee trauma.  Tibial plateau fracture. EXAM: CT OF THE LEFT  KNEE WITHOUT CONTRAST TECHNIQUE: Multidetector CT imaging of the left knee was performed according to the standard protocol. Multiplanar CT image reconstructions were also generated. RADIATION DOSE REDUCTION: This exam was performed according to the departmental dose-optimization  program which includes automated exposure control, adjustment of the mA and/or kV according to patient size and/or use of iterative reconstruction technique. COMPARISON:  Radiographs same date. FINDINGS: Bones/Joint/Cartilage The bones are demineralized. As seen on prior radiographs, there is a nondisplaced acute intra-articular fracture of the proximal tibia. Proximally, this extends into the intercondylar region and involves the anterior aspect of the lateral tibial plateau. There is no significant depression of the weight-bearing articular surface. There are nondisplaced fractures extending into the tibial metaphysis, with an anterior component just proximal to the tubercle. The proximal fibula, distal femur and patella appear intact. There is a moderate size lipohemarthrosis. Ligaments Suboptimally assessed by CT. The cruciate and collateral ligaments appear grossly intact. Muscles and Tendons No focal intramuscular hematoma or atrophy identified. The quadriceps and patellar tendons appear intact. Soft tissues No periarticular fluid collection, foreign body or soft tissue emphysema. Mild atherosclerosis. IMPRESSION: 1. Acute nondisplaced intra-articular fracture of the proximal tibia as described with associated lipohemarthrosis. No significant depression of the weight-bearing tubular surface of either tibial plateau. 2. No other acute osseous findings identified. 3. The cruciate and collateral ligaments appear grossly intact. Electronically Signed   By: Carey Bullocks M.D.   On: 04/26/2023 14:45   DG Knee Complete 4 Views Left  Result Date: 04/26/2023 CLINICAL DATA:  Infrapatellar pain after a fall. EXAM: LEFT KNEE - COMPLETE 4  VIEW COMPARISON:  None available FINDINGS: A nondisplaced fracture is present in the proximal tibial metaphysis. The fracture extends to the posterior aspect of the tibial plateau. No significant depression is present. No foreign body is present. A moderate joint effusion is present. The patella is intact. IMPRESSION: Nondisplaced fracture of the proximal tibial metaphysis extending to the posterior aspect of the tibial plateau. Electronically Signed   By: Marin Roberts M.D.   On: 04/26/2023 11:41    Pertinent labs & imaging results that were available during my care of the patient were reviewed by me and considered in my medical decision making (see MDM for details).  Medications Ordered in ED Medications  HYDROmorphone (DILAUDID) injection 1 mg (1 mg Intravenous Given 04/26/23 1138)  sodium chloride 0.9 % bolus 500 mL (0 mLs Intravenous Stopped 04/26/23 1217)  ondansetron (ZOFRAN) injection 4 mg (4 mg Intravenous Given 04/26/23 1514)  HYDROcodone-acetaminophen (NORCO/VICODIN) 5-325 MG per tablet 1 tablet (1 tablet Oral Given 04/26/23 1609)                                                                                                                                     Procedures Procedures  (including critical care time)  Medical Decision Making / ED Course    Medical Decision Making:    Tremya Defibaugh is a 77 y.o. female with past medical history as below, significant for depression, alcohol use, HLD, DDD who presents to the ED with complaint of fall, left knee injury. The complaint involves an extensive differential  diagnosis and also carries with it a high risk of complications and morbidity.  Serious etiology was considered. Ddx includes but is not limited to: sprain, Cowan, fx, dislocation, contusion, etc  Complete initial physical exam performed, notably the patient  was NAD, unable to flex/extend knee left.    Reviewed and confirmed nursing documentation for past  medical history, family history, social history.  Vital signs reviewed.    Clinical Course as of 04/27/23 0739  Fri Apr 26, 2023  1159 Tibial plat fx, get CT,  consulted ortho [SG]    Clinical Course User Index [SG] Sloan Leiter, DO     Labs stable, found to have tibial plateau fracture on imaging.  Distal pulses intact.  Discussed orthopedics, see consult note.  Patient given knee immobilizer, crutches, advised to follow-up with Dr. Jena Gauss early next week for evaluation. Give analgesics for home, she is ambulatory with crutches, nonweightbearing to left leg.  Neighbor will come pick her up and will help take care of her at home  Strict return precautions were emphasized  The patient improved significantly and was discharged in stable condition. Detailed discussions were had with the patient regarding current findings, and need for close f/u with PCP or on call doctor. The patient has been instructed to return immediately if the symptoms worsen in any way for re-evaluation. Patient verbalized understanding and is in agreement with current care plan. All questions answered prior to discharge.                Additional history obtained: -Additional history obtained from na -External records from outside source obtained and reviewed including: Chart review including previous notes, labs, imaging, consultation notes including  Primary care documentation, home medications, prior labs and imaging   Lab Tests: -I ordered, reviewed, and interpreted labs.   The pertinent results include:   Labs Reviewed  CBC WITH DIFFERENTIAL/PLATELET - Abnormal; Notable for the following components:      Result Value   Neutro Abs 8.0 (*)    All other components within normal limits  BASIC METABOLIC PANEL - Abnormal; Notable for the following components:   CO2 21 (*)    Glucose, Bld 110 (*)    Calcium 8.6 (*)    All other components within normal limits    Notable for stable labs  EKG    EKG Interpretation Date/Time:    Ventricular Rate:    PR Interval:    QRS Duration:    QT Interval:    QTC Calculation:   R Axis:      Text Interpretation:           Imaging Studies ordered: I ordered imaging studies including knee xr, ct knee I independently visualized the following imaging with scope of interpretation limited to determining acute life threatening conditions related to emergency care; findings noted above, significant for tib plat fx I independently visualized and interpreted imaging. I agree with the radiologist interpretation   Medicines ordered and prescription drug management: Meds ordered this encounter  Medications   HYDROmorphone (DILAUDID) injection 1 mg   sodium chloride 0.9 % bolus 500 mL   ondansetron (ZOFRAN) injection 4 mg   oxyCODONE (ROXICODONE) 5 MG immediate release tablet    Sig: Take 1 tablet (5 mg total) by mouth every 4 (four) hours as needed for severe pain.    Dispense:  15 tablet    Refill:  0   acetaminophen (TYLENOL) 325 MG tablet    Sig: Take 2 tablets (650  mg total) by mouth every 6 (six) hours as needed.    Dispense:  36 tablet    Refill:  0   HYDROcodone-acetaminophen (NORCO/VICODIN) 5-325 MG per tablet 1 tablet    -I have reviewed the patients home medicines and have made adjustments as needed   Consultations Obtained: I requested consultation with the ortho,  and discussed lab and imaging findings as well as pertinent plan - they recommend: see in clinic   Cardiac Monitoring: Continuous pulse oximetry interpreted by myself, 99% on RA.    Social Determinants of Health:  Diagnosis or treatment significantly limited by social determinants of health: former smoker   Reevaluation: After the interventions noted above, I reevaluated the patient and found that they have improved  Co morbidities that complicate the patient evaluation  Past Medical History:  Diagnosis Date   Alcohol abuse    Allergic rhinitis     Allergy    seasonal   Arthritis    Cancer (HCC) 2002   BREAST   Depression    Family history of cancer    multiple   Hearing impaired    wears hearing aids   Hemangioma of liver 2005   MRI    Osteoporosis    Seborrheic dermatitis    Vulvodynia    longstanding, failed Premarin cream; can't tolerate pelvic exams   Wears glasses       Dispostion: Disposition decision including need for hospitalization was considered, and patient discharged from emergency department.    Final Clinical Impression(s) / ED Diagnoses Final diagnoses:  Closed fracture of left tibial plateau, initial encounter        Sloan Leiter, DO 04/27/23 8469

## 2023-04-30 DIAGNOSIS — S82102A Unspecified fracture of upper end of left tibia, initial encounter for closed fracture: Secondary | ICD-10-CM | POA: Diagnosis not present

## 2023-04-30 DIAGNOSIS — M25572 Pain in left ankle and joints of left foot: Secondary | ICD-10-CM | POA: Diagnosis not present

## 2023-05-02 ENCOUNTER — Telehealth: Payer: Self-pay

## 2023-05-02 NOTE — Transitions of Care (Post Inpatient/ED Visit) (Signed)
05/02/2023  Name: Emily Cowan MRN: [id number] DOB: 03-25-1946  Today's TOC FU Call Status: Today's TOC FU Call Status:: Successful TOC FU Call Completed TOC FU Call Complete Date: 05/02/23 Patient's Name and Date of Birth confirmed.  Red on EMMI-ED Discharge Alert Date & Reason:04/28/23 "Scheduled follow-up appt? No"   Transition Care Management Follow-up Telephone Call Date of Discharge: 04/26/23 Discharge Facility: [facility]) Type of Discharge: Emergency Department Reason for ED Visit: Other: ("closed fracture of left tibial plateau") How have you been since you were released from the hospital?: Better (Pt voices she is doing fairly well-just got out of shower-pain controlled. Current pain 5/10-after having showered & increased moving around-last took pain med-7:30am.-wearing immobilizer. Appetite good. She has had a BM.) Any questions or concerns?: No  Items Reviewed: Did you receive and understand the discharge instructions provided?: Yes Medications obtained,verified, and reconciled?: Partial Review Completed Reason for Partial Mediation Review: pt just getting out of shower and getting dressed-brief call-discussed pain mgmt meds Any new allergies since your discharge?: No Dietary orders reviewed?: NA Do you have support at home?: Yes People in Home: sibling(s) Name of Support/Comfort Primary Source: sister  Medications Reviewed Today: Medications Reviewed Today     Reviewed by Charlyn Minerva, RN (Registered Nurse) on 05/02/23 at 1046  Med List Status: <None>   Medication Order Taking? Sig Documenting Provider Last Dose Status Informant  acetaminophen (TYLENOL) 325 MG tablet [id number] Yes Take 2 tablets (650 mg total) by mouth every 6 (six) hours as needed. Sloan Leiter, DO Taking Active   Ascorbic Acid (VITAMIN C) 500 MG CAPS [id number]   [provider]  Active   Blood Glucose Monitoring Suppl DEVI [id number]  Test 1-2 times day. Pend  on [employer]  Active   FLUoxetine (PROZAC) 40 MG capsule [id number]  TAKE 1 CAPSULE EVERY DAY Ronnald Nian, MD  Active   Glucosamine-Chondroit-Vit C-Mn (GLUCOSAMINE CHONDR 1500 COMPLX) CAPS [id number]  Take 1 capsule by mouth daily. [provider]  Active   Glucose Blood (BLOOD GLUCOSE TEST STRIPS) STRP [id number]  Test 1-2 times daily Genia Del  Active   Lancet Device MISC [id number]  1-2 times daily Tysinger, Kermit Balo, PA-C  Active   Lancets Misc. MISC [id number]  1-2 times a day Genia Del  Active   Magnesium 250 MG TABS [id number]  Take 1 tablet by mouth at bedtime. [provider]  Active   Multiple Vitamins-Minerals (MULTIVITAMIN WITH MINERALS) tablet [id number]  Take 1 tablet by mouth daily.   [provider]  Active   omeprazole (PRILOSEC) 40 MG capsule [id number]  TAKE 1 CAPSULE EVERY DAY Tysinger, Kermit Balo, PA-C  Active   oxyCODONE (ROXICODONE) 5 MG immediate release tablet [id number] Yes Take 1 tablet (5 mg total) by mouth every 4 (four) hours as needed for severe pain. Sloan Leiter, DO Taking Active   rosuvastatin (CRESTOR) 10 MG tablet [id number]  TAKE 1 TABLET AT BEDTIME TysingerKermit Balo, PA-C  Active   zolpidem (AMBIEN) 5 MG tablet [id number]  TAKE 1 TABLET AT BEDTIME AS NEEDED FOR SLEEP Tysinger, Kermit Balo, PA-C  Active            Med Note Katrinka Blazing, Roma Schanz   Wed Apr 03, 2023  8:14 AM) Rochele Pages one last night            Home Care and Equipment/Supplies: Were Home Health Services Ordered?: NA Any new equipment or  medical supplies ordered?: NA  Functional Questionnaire: Do you need assistance with bathing/showering or dressing?: Yes Do you need assistance with meal preparation?: Yes Do you need assistance with eating?: No Do you have difficulty maintaining continence: No Do you need assistance with getting out of bed/getting out of a chair/moving?: Yes Do you have difficulty managing or taking your  medications?: No  Follow up appointments reviewed: PCP Follow-up appointment confirmed?: NA (Pt just saw PCP on 04/18/23) Specialist Hospital Follow-up appointment confirmed?: Yes Date of Specialist follow-up appointment?: 04/30/23 Follow-Up Specialty Provider:: Dr. Derinda Sis Do you need transportation to your follow-up appointment?: No (pt confirms she has support system in place-helping her out while she recovers) Do you understand care options if your condition(s) worsen?: Yes-patient verbalized understanding  SDOH Interventions Today    Flowsheet Row Most Recent Value  SDOH Interventions   Food Insecurity Interventions Intervention Not Indicated  Transportation Interventions Intervention Not Indicated      TOC Interventions Today    Flowsheet Row Most Recent Value  TOC Interventions   TOC Interventions Discussed/Reviewed TOC Interventions Discussed, Post discharge activity limitations per provider      Interventions Today    Flowsheet Row Most Recent Value  General Interventions   General Interventions Discussed/Reviewed General Interventions Discussed, Doctor Visits  Doctor Visits Discussed/Reviewed Doctor Visits Discussed, PCP, Specialist  PCP/Specialist Visits Compliance with follow-up visit  Education Interventions   Education Provided Provided Education  Provided Verbal Education On When to see the doctor, Medication, Other  [pain mgmt, bowel mgmt]  Nutrition Interventions   Nutrition Discussed/Reviewed Nutrition Discussed  Pharmacy Interventions   Pharmacy Dicussed/Reviewed Pharmacy Topics Discussed, Medications and their functions  Safety Interventions   Safety Discussed/Reviewed Safety Discussed, Home Safety, Fall Risk  Home Safety Assistive Devices       [address], [place] [facility] Care Management Care Management Community Coordinator Direct Phone: [phone] Toll Free: [phone] Fax: [phone]

## 2023-05-14 DIAGNOSIS — S82142D Displaced bicondylar fracture of left tibia, subsequent encounter for closed fracture with routine healing: Secondary | ICD-10-CM | POA: Diagnosis not present

## 2023-05-17 DIAGNOSIS — W19XXXD Unspecified fall, subsequent encounter: Secondary | ICD-10-CM | POA: Diagnosis not present

## 2023-05-17 DIAGNOSIS — S82142D Displaced bicondylar fracture of left tibia, subsequent encounter for closed fracture with routine healing: Secondary | ICD-10-CM | POA: Diagnosis not present

## 2023-05-17 DIAGNOSIS — Z9181 History of falling: Secondary | ICD-10-CM | POA: Diagnosis not present

## 2023-05-21 DIAGNOSIS — W19XXXD Unspecified fall, subsequent encounter: Secondary | ICD-10-CM | POA: Diagnosis not present

## 2023-05-21 DIAGNOSIS — Z9181 History of falling: Secondary | ICD-10-CM | POA: Diagnosis not present

## 2023-05-21 DIAGNOSIS — S82142D Displaced bicondylar fracture of left tibia, subsequent encounter for closed fracture with routine healing: Secondary | ICD-10-CM | POA: Diagnosis not present

## 2023-05-24 DIAGNOSIS — Z9181 History of falling: Secondary | ICD-10-CM | POA: Diagnosis not present

## 2023-05-24 DIAGNOSIS — S82142D Displaced bicondylar fracture of left tibia, subsequent encounter for closed fracture with routine healing: Secondary | ICD-10-CM | POA: Diagnosis not present

## 2023-05-24 DIAGNOSIS — W19XXXD Unspecified fall, subsequent encounter: Secondary | ICD-10-CM | POA: Diagnosis not present

## 2023-05-28 ENCOUNTER — Other Ambulatory Visit: Payer: Self-pay | Admitting: Medical

## 2023-05-29 DIAGNOSIS — Z9181 History of falling: Secondary | ICD-10-CM | POA: Diagnosis not present

## 2023-05-29 DIAGNOSIS — W19XXXD Unspecified fall, subsequent encounter: Secondary | ICD-10-CM | POA: Diagnosis not present

## 2023-05-29 DIAGNOSIS — S82142D Displaced bicondylar fracture of left tibia, subsequent encounter for closed fracture with routine healing: Secondary | ICD-10-CM | POA: Diagnosis not present

## 2023-05-30 ENCOUNTER — Encounter: Payer: Self-pay | Admitting: Internal Medicine

## 2023-05-30 DIAGNOSIS — Z9181 History of falling: Secondary | ICD-10-CM | POA: Diagnosis not present

## 2023-05-30 DIAGNOSIS — W19XXXD Unspecified fall, subsequent encounter: Secondary | ICD-10-CM | POA: Diagnosis not present

## 2023-05-30 DIAGNOSIS — S82142D Displaced bicondylar fracture of left tibia, subsequent encounter for closed fracture with routine healing: Secondary | ICD-10-CM | POA: Diagnosis not present

## 2023-06-02 ENCOUNTER — Other Ambulatory Visit: Payer: Self-pay | Admitting: Family Medicine

## 2023-06-03 DIAGNOSIS — Z9181 History of falling: Secondary | ICD-10-CM | POA: Diagnosis not present

## 2023-06-03 DIAGNOSIS — W19XXXD Unspecified fall, subsequent encounter: Secondary | ICD-10-CM | POA: Diagnosis not present

## 2023-06-03 DIAGNOSIS — S82142D Displaced bicondylar fracture of left tibia, subsequent encounter for closed fracture with routine healing: Secondary | ICD-10-CM | POA: Diagnosis not present

## 2023-06-04 DIAGNOSIS — S82142D Displaced bicondylar fracture of left tibia, subsequent encounter for closed fracture with routine healing: Secondary | ICD-10-CM | POA: Diagnosis not present

## 2023-06-07 DIAGNOSIS — Z9181 History of falling: Secondary | ICD-10-CM | POA: Diagnosis not present

## 2023-06-07 DIAGNOSIS — W19XXXD Unspecified fall, subsequent encounter: Secondary | ICD-10-CM | POA: Diagnosis not present

## 2023-06-07 DIAGNOSIS — S82142D Displaced bicondylar fracture of left tibia, subsequent encounter for closed fracture with routine healing: Secondary | ICD-10-CM | POA: Diagnosis not present

## 2023-06-11 DIAGNOSIS — Z9181 History of falling: Secondary | ICD-10-CM | POA: Diagnosis not present

## 2023-06-11 DIAGNOSIS — S82142D Displaced bicondylar fracture of left tibia, subsequent encounter for closed fracture with routine healing: Secondary | ICD-10-CM | POA: Diagnosis not present

## 2023-06-11 DIAGNOSIS — W19XXXD Unspecified fall, subsequent encounter: Secondary | ICD-10-CM | POA: Diagnosis not present

## 2023-06-13 DIAGNOSIS — W19XXXD Unspecified fall, subsequent encounter: Secondary | ICD-10-CM | POA: Diagnosis not present

## 2023-06-13 DIAGNOSIS — Z9181 History of falling: Secondary | ICD-10-CM | POA: Diagnosis not present

## 2023-06-13 DIAGNOSIS — S82142D Displaced bicondylar fracture of left tibia, subsequent encounter for closed fracture with routine healing: Secondary | ICD-10-CM | POA: Diagnosis not present

## 2023-06-14 DIAGNOSIS — W19XXXD Unspecified fall, subsequent encounter: Secondary | ICD-10-CM | POA: Diagnosis not present

## 2023-06-14 DIAGNOSIS — S82142D Displaced bicondylar fracture of left tibia, subsequent encounter for closed fracture with routine healing: Secondary | ICD-10-CM | POA: Diagnosis not present

## 2023-06-14 DIAGNOSIS — Z9181 History of falling: Secondary | ICD-10-CM | POA: Diagnosis not present

## 2023-06-18 DIAGNOSIS — S82142D Displaced bicondylar fracture of left tibia, subsequent encounter for closed fracture with routine healing: Secondary | ICD-10-CM | POA: Diagnosis not present

## 2023-06-18 DIAGNOSIS — Z9181 History of falling: Secondary | ICD-10-CM | POA: Diagnosis not present

## 2023-06-18 DIAGNOSIS — W19XXXD Unspecified fall, subsequent encounter: Secondary | ICD-10-CM | POA: Diagnosis not present

## 2023-06-25 ENCOUNTER — Telehealth: Payer: Self-pay | Admitting: Internal Medicine

## 2023-06-25 DIAGNOSIS — M810 Age-related osteoporosis without current pathological fracture: Secondary | ICD-10-CM

## 2023-06-25 MED ORDER — DENOSUMAB 60 MG/ML ~~LOC~~ SOSY
60.0000 mg | PREFILLED_SYRINGE | Freq: Once | SUBCUTANEOUS | Status: AC
Start: 2023-07-21 — End: ?

## 2023-06-25 NOTE — Telephone Encounter (Signed)
See prolia referral

## 2023-06-27 DIAGNOSIS — W19XXXD Unspecified fall, subsequent encounter: Secondary | ICD-10-CM | POA: Diagnosis not present

## 2023-06-27 DIAGNOSIS — S82142D Displaced bicondylar fracture of left tibia, subsequent encounter for closed fracture with routine healing: Secondary | ICD-10-CM | POA: Diagnosis not present

## 2023-06-27 DIAGNOSIS — Z9181 History of falling: Secondary | ICD-10-CM | POA: Diagnosis not present

## 2023-07-02 DIAGNOSIS — S82142D Displaced bicondylar fracture of left tibia, subsequent encounter for closed fracture with routine healing: Secondary | ICD-10-CM | POA: Diagnosis not present

## 2023-07-04 DIAGNOSIS — S82142D Displaced bicondylar fracture of left tibia, subsequent encounter for closed fracture with routine healing: Secondary | ICD-10-CM | POA: Diagnosis not present

## 2023-07-04 DIAGNOSIS — Z9181 History of falling: Secondary | ICD-10-CM | POA: Diagnosis not present

## 2023-07-04 DIAGNOSIS — W19XXXD Unspecified fall, subsequent encounter: Secondary | ICD-10-CM | POA: Diagnosis not present

## 2023-07-11 DIAGNOSIS — Z9181 History of falling: Secondary | ICD-10-CM | POA: Diagnosis not present

## 2023-07-11 DIAGNOSIS — S82142D Displaced bicondylar fracture of left tibia, subsequent encounter for closed fracture with routine healing: Secondary | ICD-10-CM | POA: Diagnosis not present

## 2023-07-11 DIAGNOSIS — W19XXXD Unspecified fall, subsequent encounter: Secondary | ICD-10-CM | POA: Diagnosis not present

## 2023-07-17 DIAGNOSIS — M6281 Muscle weakness (generalized): Secondary | ICD-10-CM | POA: Diagnosis not present

## 2023-07-17 DIAGNOSIS — S82132A Displaced fracture of medial condyle of left tibia, initial encounter for closed fracture: Secondary | ICD-10-CM | POA: Diagnosis not present

## 2023-07-17 DIAGNOSIS — M25662 Stiffness of left knee, not elsewhere classified: Secondary | ICD-10-CM | POA: Diagnosis not present

## 2023-07-17 DIAGNOSIS — R2681 Unsteadiness on feet: Secondary | ICD-10-CM | POA: Diagnosis not present

## 2023-07-23 ENCOUNTER — Other Ambulatory Visit (INDEPENDENT_AMBULATORY_CARE_PROVIDER_SITE_OTHER): Payer: Medicare HMO

## 2023-07-23 DIAGNOSIS — M810 Age-related osteoporosis without current pathological fracture: Secondary | ICD-10-CM

## 2023-07-23 MED ORDER — DENOSUMAB 60 MG/ML ~~LOC~~ SOSY
60.0000 mg | PREFILLED_SYRINGE | Freq: Once | SUBCUTANEOUS | Status: AC
Start: 2023-07-23 — End: 2023-07-23
  Administered 2023-07-23: 60 mg via SUBCUTANEOUS

## 2023-07-31 DIAGNOSIS — S82135D Nondisplaced fracture of medial condyle of left tibia, subsequent encounter for closed fracture with routine healing: Secondary | ICD-10-CM | POA: Diagnosis not present

## 2023-07-31 DIAGNOSIS — R2681 Unsteadiness on feet: Secondary | ICD-10-CM | POA: Diagnosis not present

## 2023-07-31 DIAGNOSIS — M25562 Pain in left knee: Secondary | ICD-10-CM | POA: Diagnosis not present

## 2023-07-31 DIAGNOSIS — M25662 Stiffness of left knee, not elsewhere classified: Secondary | ICD-10-CM | POA: Diagnosis not present

## 2023-08-02 DIAGNOSIS — S82135D Nondisplaced fracture of medial condyle of left tibia, subsequent encounter for closed fracture with routine healing: Secondary | ICD-10-CM | POA: Diagnosis not present

## 2023-08-02 DIAGNOSIS — M25562 Pain in left knee: Secondary | ICD-10-CM | POA: Diagnosis not present

## 2023-08-02 DIAGNOSIS — M25662 Stiffness of left knee, not elsewhere classified: Secondary | ICD-10-CM | POA: Diagnosis not present

## 2023-08-02 DIAGNOSIS — R2681 Unsteadiness on feet: Secondary | ICD-10-CM | POA: Diagnosis not present

## 2023-08-06 DIAGNOSIS — M25562 Pain in left knee: Secondary | ICD-10-CM | POA: Diagnosis not present

## 2023-08-06 DIAGNOSIS — S82135D Nondisplaced fracture of medial condyle of left tibia, subsequent encounter for closed fracture with routine healing: Secondary | ICD-10-CM | POA: Diagnosis not present

## 2023-08-06 DIAGNOSIS — R2681 Unsteadiness on feet: Secondary | ICD-10-CM | POA: Diagnosis not present

## 2023-08-06 DIAGNOSIS — M25662 Stiffness of left knee, not elsewhere classified: Secondary | ICD-10-CM | POA: Diagnosis not present

## 2023-08-08 DIAGNOSIS — S82135D Nondisplaced fracture of medial condyle of left tibia, subsequent encounter for closed fracture with routine healing: Secondary | ICD-10-CM | POA: Diagnosis not present

## 2023-08-08 DIAGNOSIS — R2681 Unsteadiness on feet: Secondary | ICD-10-CM | POA: Diagnosis not present

## 2023-08-08 DIAGNOSIS — M25562 Pain in left knee: Secondary | ICD-10-CM | POA: Diagnosis not present

## 2023-08-08 DIAGNOSIS — M25662 Stiffness of left knee, not elsewhere classified: Secondary | ICD-10-CM | POA: Diagnosis not present

## 2023-08-14 ENCOUNTER — Other Ambulatory Visit: Payer: Self-pay | Admitting: Medical

## 2023-08-15 ENCOUNTER — Telehealth: Payer: Self-pay | Admitting: Family Medicine

## 2023-08-16 ENCOUNTER — Other Ambulatory Visit: Payer: Self-pay | Admitting: Medical

## 2023-08-29 NOTE — Telephone Encounter (Signed)
Dt  

## 2023-09-03 DIAGNOSIS — S82142D Displaced bicondylar fracture of left tibia, subsequent encounter for closed fracture with routine healing: Secondary | ICD-10-CM | POA: Diagnosis not present

## 2023-10-05 ENCOUNTER — Other Ambulatory Visit: Payer: Self-pay | Admitting: Medical

## 2023-10-07 ENCOUNTER — Encounter: Payer: Self-pay | Admitting: Internal Medicine

## 2023-11-15 ENCOUNTER — Other Ambulatory Visit: Payer: Self-pay | Admitting: Internal Medicine

## 2023-11-15 DIAGNOSIS — M810 Age-related osteoporosis without current pathological fracture: Secondary | ICD-10-CM

## 2023-11-15 MED ORDER — DENOSUMAB 60 MG/ML ~~LOC~~ SOSY
60.0000 mg | PREFILLED_SYRINGE | Freq: Once | SUBCUTANEOUS | Status: AC
Start: 2024-01-21 — End: 2024-01-22
  Administered 2024-01-22: 60 mg via SUBCUTANEOUS

## 2023-11-15 NOTE — Progress Notes (Unsigned)
 See prolia referral

## 2023-11-18 ENCOUNTER — Other Ambulatory Visit: Payer: Self-pay | Admitting: Medical

## 2023-11-19 ENCOUNTER — Telehealth: Payer: Self-pay | Admitting: Medical

## 2023-11-19 NOTE — Telephone Encounter (Signed)
She is already scheduled

## 2023-11-19 NOTE — Telephone Encounter (Signed)
 Please get her on the schedule for well visit

## 2023-11-26 ENCOUNTER — Encounter: Payer: Self-pay | Admitting: Medical

## 2023-11-26 ENCOUNTER — Ambulatory Visit: Payer: Medicare HMO | Admitting: Medical

## 2023-11-26 ENCOUNTER — Other Ambulatory Visit: Payer: Self-pay | Admitting: Medical

## 2023-11-26 VITALS — BP 122/80 | HR 81 | Ht 67.0 in | Wt 158.4 lb

## 2023-11-26 DIAGNOSIS — G3184 Mild cognitive impairment, so stated: Secondary | ICD-10-CM

## 2023-11-26 DIAGNOSIS — Z0000 Encounter for general adult medical examination without abnormal findings: Secondary | ICD-10-CM | POA: Diagnosis not present

## 2023-11-26 DIAGNOSIS — M810 Age-related osteoporosis without current pathological fracture: Secondary | ICD-10-CM

## 2023-11-26 DIAGNOSIS — H9313 Tinnitus, bilateral: Secondary | ICD-10-CM | POA: Diagnosis not present

## 2023-11-26 DIAGNOSIS — E782 Mixed hyperlipidemia: Secondary | ICD-10-CM

## 2023-11-26 DIAGNOSIS — Z974 Presence of external hearing-aid: Secondary | ICD-10-CM

## 2023-11-26 DIAGNOSIS — H903 Sensorineural hearing loss, bilateral: Secondary | ICD-10-CM | POA: Diagnosis not present

## 2023-11-26 DIAGNOSIS — F1090 Alcohol use, unspecified, uncomplicated: Secondary | ICD-10-CM

## 2023-11-26 DIAGNOSIS — E559 Vitamin D deficiency, unspecified: Secondary | ICD-10-CM | POA: Diagnosis not present

## 2023-11-26 DIAGNOSIS — J301 Allergic rhinitis due to pollen: Secondary | ICD-10-CM

## 2023-11-26 DIAGNOSIS — R251 Tremor, unspecified: Secondary | ICD-10-CM | POA: Diagnosis not present

## 2023-11-26 DIAGNOSIS — M5030 Other cervical disc degeneration, unspecified cervical region: Secondary | ICD-10-CM

## 2023-11-26 DIAGNOSIS — E2839 Other primary ovarian failure: Secondary | ICD-10-CM

## 2023-11-26 DIAGNOSIS — D369 Benign neoplasm, unspecified site: Secondary | ICD-10-CM

## 2023-11-26 DIAGNOSIS — K219 Gastro-esophageal reflux disease without esophagitis: Secondary | ICD-10-CM | POA: Diagnosis not present

## 2023-11-26 DIAGNOSIS — F5104 Psychophysiologic insomnia: Secondary | ICD-10-CM

## 2023-11-26 DIAGNOSIS — Z77122 Contact with and (suspected) exposure to noise: Secondary | ICD-10-CM | POA: Diagnosis not present

## 2023-11-26 DIAGNOSIS — Z79899 Other long term (current) drug therapy: Secondary | ICD-10-CM

## 2023-11-26 DIAGNOSIS — Z7185 Encounter for immunization safety counseling: Secondary | ICD-10-CM

## 2023-11-26 DIAGNOSIS — F341 Dysthymic disorder: Secondary | ICD-10-CM

## 2023-11-26 DIAGNOSIS — Z7189 Other specified counseling: Secondary | ICD-10-CM

## 2023-11-26 DIAGNOSIS — Z822 Family history of deafness and hearing loss: Secondary | ICD-10-CM | POA: Diagnosis not present

## 2023-11-26 LAB — LIPID PANEL

## 2023-11-26 MED ORDER — OMEPRAZOLE 40 MG PO CPDR: 40.0000 mg | DELAYED_RELEASE_CAPSULE | Freq: Every day | ORAL | 2 refills | Status: DC

## 2023-11-26 MED ORDER — ROSUVASTATIN CALCIUM 10 MG PO TABS: 10.0000 mg | ORAL_TABLET | Freq: Every day | ORAL | 2 refills | Status: DC

## 2023-11-26 MED ORDER — FLUOXETINE HCL 40 MG PO CAPS: 40.0000 mg | ORAL_CAPSULE | Freq: Every day | ORAL | 2 refills | Status: DC

## 2023-11-26 NOTE — Progress Notes (Signed)
 Subjective:   HPI  Emily Cowan is a 78 y.o. female who presents for Chief Complaint  Patient presents with  . Annual Exam    Fasting cpe, would like to wait for labs until later this week. No concerns    Patient Care Team: Chrystian Cupples, Kermit Balo, PA-C as PCP - General (Family Medicine) Dr. Erick Blinks, GI Dr. Truitt Merle, ortho Dr. Huston Foley, neurology Eye doctor    Concerns: She is compliant with her cholesterol medicine daily without complaint  Osteoporosis-she is getting weightbearing and aerobic exercise, takes calcium and vitamin D daily and is on Prolia injection every 6 months  History of depression-currently on fluoxetine and does well on this  Acid reflux-she continues on omeprazole. Does want to continue this.  She ended up breaking her left leg back in December 2024.  Her dog got in front of her and she ended up tripping and breaking her leg.  She did not require surgery.  She is back to her baseline now doing okay.  She did some physical therapy for period time but ended up going to stay with her sister in [place] for a bit of the time to finish with healing up  Exercise My current exercise reported today is: Gardening, walking, some weight bearing   Eating habits: My current eating habits include: Variety of fruits and vegetables   Reviewed their medical, surgical, family, social, medication, and allergy history and updated chart as appropriate.  Allergies  Allergen Reactions  . Sulfa Antibiotics Nausea And Vomiting    Past Medical History:  Diagnosis Date  . Alcohol abuse   . Allergic rhinitis   . Allergy    seasonal  . Arthritis   . Cancer (HCC) 2002   BREAST  . Depression   . Family history of cancer    multiple  . Hearing impaired    wears hearing aids  . Hemangioma of liver 2005   MRI   . Osteoporosis   . Seborrheic dermatitis   . Vulvodynia    longstanding, failed Premarin cream; can't tolerate pelvic exams  . Wears  glasses     Current Outpatient Medications on File Prior to Visit  Medication Sig Dispense Refill  . acetaminophen (TYLENOL) 325 MG tablet Take 2 tablets (650 mg total) by mouth every 6 (six) hours as needed. 36 tablet 0  . Ascorbic Acid (VITAMIN C) 500 MG CAPS     . FLUoxetine (PROZAC) 40 MG capsule TAKE 1 CAPSULE EVERY DAY 90 capsule 0  . Glucosamine-Chondroit-Vit C-Mn (GLUCOSAMINE CHONDR 1500 COMPLX) CAPS Take 1 capsule by mouth daily.    . Magnesium 250 MG TABS Take 1 tablet by mouth at bedtime.    . Multiple Vitamins-Minerals (MULTIVITAMIN WITH MINERALS) tablet Take 1 tablet by mouth daily.      Marland Kitchen omeprazole (PRILOSEC) 40 MG capsule TAKE 1 CAPSULE EVERY DAY 90 capsule 0  . rosuvastatin (CRESTOR) 10 MG tablet TAKE 1 TABLET AT BEDTIME 90 tablet 0  . zolpidem (AMBIEN) 5 MG tablet TAKE 1 TABLET AT BEDTIME AS NEEDED FOR SLEEP 90 tablet 0  . Blood Glucose Monitoring Suppl DEVI Test 1-2 times day. Pend on Insurance 1 each 0  . Glucose Blood (BLOOD GLUCOSE TEST STRIPS) STRP Test 1-2 times daily 100 strip 0  . Lancet Device MISC 1-2 times daily 1 each 0  . Lancets Misc. MISC 1-2 times a day 100 each 0  . oxyCODONE (ROXICODONE) 5 MG immediate release tablet Take 1 tablet (5  mg total) by mouth every 4 (four) hours as needed for severe pain. (Patient not taking: Reported on 11/26/2023) 15 tablet 0   Current Facility-Administered Medications on File Prior to Visit  Medication Dose Route Frequency Provider Last Rate Last Admin  . denosumab (PROLIA) injection 60 mg  60 mg Subcutaneous Once Lateasha Breuer S, PA-C      . [START ON 01/21/2024] denosumab (PROLIA) injection 60 mg  60 mg Subcutaneous Once Jac Canavan, PA-C          Current Outpatient Medications:  .  acetaminophen (TYLENOL) 325 MG tablet, Take 2 tablets (650 mg total) by mouth every 6 (six) hours as needed., Disp: 36 tablet, Rfl: 0 .  Ascorbic Acid (VITAMIN C) 500 MG CAPS, , Disp: , Rfl:  .  FLUoxetine (PROZAC) 40 MG capsule, TAKE  1 CAPSULE EVERY DAY, Disp: 90 capsule, Rfl: 0 .  Glucosamine-Chondroit-Vit C-Mn (GLUCOSAMINE CHONDR 1500 COMPLX) CAPS, Take 1 capsule by mouth daily., Disp: , Rfl:  .  Magnesium 250 MG TABS, Take 1 tablet by mouth at bedtime., Disp: , Rfl:  .  Multiple Vitamins-Minerals (MULTIVITAMIN WITH MINERALS) tablet, Take 1 tablet by mouth daily.  , Disp: , Rfl:  .  omeprazole (PRILOSEC) 40 MG capsule, TAKE 1 CAPSULE EVERY DAY, Disp: 90 capsule, Rfl: 0 .  rosuvastatin (CRESTOR) 10 MG tablet, TAKE 1 TABLET AT BEDTIME, Disp: 90 tablet, Rfl: 0 .  zolpidem (AMBIEN) 5 MG tablet, TAKE 1 TABLET AT BEDTIME AS NEEDED FOR SLEEP, Disp: 90 tablet, Rfl: 0 .  Blood Glucose Monitoring Suppl DEVI, Test 1-2 times day. Pend on Insurance, Disp: 1 each, Rfl: 0 .  Glucose Blood (BLOOD GLUCOSE TEST STRIPS) STRP, Test 1-2 times daily, Disp: 100 strip, Rfl: 0 .  Lancet Device MISC, 1-2 times daily, Disp: 1 each, Rfl: 0 .  Lancets Misc. MISC, 1-2 times a day, Disp: 100 each, Rfl: 0 .  oxyCODONE (ROXICODONE) 5 MG immediate release tablet, Take 1 tablet (5 mg total) by mouth every 4 (four) hours as needed for severe pain. (Patient not taking: Reported on 11/26/2023), Disp: 15 tablet, Rfl: 0  Current Facility-Administered Medications:  .  denosumab (PROLIA) injection 60 mg, 60 mg, Subcutaneous, Once, Evah Rashid S, PA-C .  [START ON 01/21/2024] denosumab (PROLIA) injection 60 mg, 60 mg, Subcutaneous, Once, Jamorion Gomillion, Kermit Balo, PA-C  Family History  Problem Relation Age of Onset  . Cancer Mother        GI cancer  . Cancer Father        liver  . Stroke Father   . Cancer Sister        vulvar  . Rectal cancer Sister 57  . Cancer Brother        liver  . Colon polyps Brother 62  . Cancer Brother        lung  . Diabetes Neg Hx   . Heart disease Neg Hx   . Hypertension Neg Hx   . Hyperlipidemia Neg Hx   . Breast cancer Neg Hx   . Hemochromatosis Neg Hx   . Stomach cancer Neg Hx   . Esophageal cancer Neg Hx   . Tremor Neg Hx      Past Surgical History:  Procedure Laterality Date  . cataract surgery    . COLONOSCOPY  2011   in [facility]  . COLONOSCOPY  2018  . MASTECTOMY  2002   right  . TONSILLECTOMY    . WISDOM TOOTH EXTRACTION  Review of Systems  Constitutional:  Negative for chills, fever, malaise/fatigue and weight loss.  HENT:  Negative for congestion, ear pain, hearing loss, sore throat and tinnitus.   Eyes:  Negative for blurred vision, pain and redness.  Respiratory:  Negative for cough, hemoptysis and shortness of breath.   Cardiovascular:  Negative for chest pain, palpitations, orthopnea, claudication and leg swelling.  Gastrointestinal:  Negative for abdominal pain, blood in stool, constipation, diarrhea, nausea and vomiting.  Genitourinary:  Negative for dysuria, flank pain, frequency, hematuria and urgency.  Musculoskeletal:  Negative for falls, joint pain and myalgias.  Skin:  Negative for itching and rash.  Neurological:  Negative for dizziness, tingling, speech change, weakness and headaches.  Endo/Heme/Allergies:  Negative for polydipsia. Does not bruise/bleed easily.  Psychiatric/Behavioral:  Negative for depression and memory loss. The patient has insomnia. The patient is not nervous/anxious.         Objective:  BP 122/80   Pulse 81   Ht 5\' 7"  (1.702 m)   Wt 158 lb 6.4 oz (71.8 kg)   BMI 24.81 kg/m   BP Readings from Last 3 Encounters:  11/26/23 122/80  04/26/23 (!) 164/100  04/18/23 130/84   Wt Readings from Last 3 Encounters:  11/26/23 158 lb 6.4 oz (71.8 kg)  04/26/23 160 lb (72.6 kg)  04/18/23 160 lb 9.6 oz (72.8 kg)    General appearance: alert, no distress, WD/WN, Caucasian female, frail Skin: scattered macules, no worrisome lesions HEENT: normocephalic, conjunctiva/corneas normal, sclerae anicteric, PERRLA, EOMi, nares patent, no discharge or erythema, pharynx normal Oral cavity: MMM, tongue normal, teeth normal Neck: supple, no lymphadenopathy, no  thyromegaly, no masses, normal ROM, no bruits Chest: non tender, normal shape and expansion Heart: RRR, normal S1, S2, no murmurs Lungs: CTA bilaterally, no wheezes, rhonchi, or rales Abdomen: +bs, soft, non tender, non distended, no masses, no hepatomegaly, no splenomegaly, no bruits Back: non tender, normal ROM, no scoliosis Musculoskeletal: upper extremities non tender, no obvious deformity, normal ROM throughout, lower extremities non tender, no obvious deformity, normal ROM throughout Extremities: no edema, no cyanosis, no clubbing Pulses: 2+ symmetric, upper and lower extremities, normal cap refill Neurological: alert, oriented x 3, CN2-12 intact, strength normal upper extremities and lower extremities, sensation normal throughout, DTRs 2+ throughout, no cerebellar signs, gait normal Psychiatric: normal affect, behavior normal, pleasant  Breast/gyn/rectal - declined     Assessment and Plan :   Encounter Diagnoses  Name Primary?  . Encounter for health maintenance examination in adult Yes  . Wears hearing aid   . Vitamin D deficiency   . Vaccine counseling   . Osteoporosis, unspecified osteoporosis type, unspecified pathological fracture presence   . Tubular adenoma   . Tremor, physiological   . Mild cognitive impairment   . Mixed hyperlipidemia   . High risk medication use   . Gastroesophageal reflux disease without esophagitis   . Estrogen deficiency   . Dysthymia   . Chronic insomnia   . DDD (degenerative disc disease), cervical   . Allergic rhinitis due to pollen, unspecified seasonality   . Alcohol use   . Advanced directives, counseling/discussion   . Encounter for subsequent annual wellness visit (AWV) in Medicare patient       This visit was a preventative care visit, also known as wellness visit or routine physical.   Topics typically include healthy lifestyle, diet, exercise, preventative care, vaccinations, sick and well care, proper use of emergency dept  and after hours care, as well as other  concerns.    Separate significant issues discussed: Chronic insomnia-you have been on Ambien long-term and stable on this medication, does fine on this..  Dysthymia, depression-you seem to do well on fluoxetine Prozac.  Continue current dose  Osteoporosis-continue Prolia injection, get weightbearing and aerobic exercise, get at least 2000 units or more vitamin D daily and at least 1200 mg of calcium daily  Acid reflux-you continue on Prilosec.  Avoid acid reflux trigger foods.  You tend to not do well off this medication  Mild cognitive impairment  Stay active with reading and interacting with others to keep your mind strong.  If your memory continues to be a concern we can refer you to neurology for further evaluation  Limit alcohol  High cholesterol-continue current medication, rosuvastatin Crestor 10 mg daily, updated labs today  History of left leg fracture 2024.   General Recommendations: Continue to return yearly for your annual wellness and preventative care visits.  This gives [place] a chance to discuss healthy lifestyle, exercise, vaccinations, review your chart record, and perform screenings where appropriate.  I recommend you see your eye doctor yearly for routine vision care.  I recommend you see your dentist yearly for routine dental care including hygiene visits twice yearly.   Vaccination recommendations were reviewed Immunization History  Administered Date(s) Administered  . Fluad Quad(high Dose 65+) 06/18/2019, 05/27/2020  . Influenza Whole 08/31/2009, 05/02/2011  . Influenza, High Dose Seasonal PF 08/14/2013, 08/17/2014, 06/01/2015, 06/19/2016, 06/20/2017, 06/05/2021  . Influenza, Seasonal, Injecte, Preservative Fre 08/22/2012  . Influenza-Unspecified 06/05/2022  . Moderna Covid-19 Vaccine Bivalent Booster 27yrs & up 06/05/2021  . Moderna Sars-Covid-2 Vaccination 10/08/2019, 11/05/2019, 07/04/2020, 06/05/2022  . PNEUMOCOCCAL  CONJUGATE-20 10/18/2022  . Pneumococcal Conjugate-13 08/17/2014  . Pneumococcal Polysaccharide-23 08/31/2009  . Td 01/18/2021  . Tdap 09/03/2009  . Zoster Recombinant(Shingrix) 06/15/2020, 10/12/2020  . Zoster, Live 08/31/2009     Screening for cancer: Colon cancer screening: Prior or last colon cancer screen: 2021 colonoscopy showing tubular adenoma.  She is due repeat this year but she declines to do any more colon cancer testing.   Breast cancer screening: After age 32, routine mammogram screening is not recommended.  It is up to you whether you continue to do screenings or not  Skin cancer screening: Check your skin regularly for new changes, growing lesions, or other lesions of concern Come in for evaluation if you have skin lesions of concern.  Lung cancer screening: If you have a greater than 20 pack year history of tobacco use, then you may qualify for lung cancer screening with a chest CT scan.   Please call your insurance company to inquire about coverage for this test.  Pancreatic cancer: no current screening test is available routinely recommended.  (Risk factors: Smoking, overweight or obese, diabetes, chronic pancreatitis, work Nurse, mental health, Solicitor, 44 year old or greater, female greater than female, African-American, family history of pancreatic cancer, hereditary breast, ovarian, melanoma, Lynch, Peutz-jeghers).  We currently don't have screenings for other cancers besides breast, cervical, colon, and lung cancers.  If you have a strong family history of cancer or have other cancer screening concerns, please let me know.    Bone health: Get at least 150 minutes of aerobic exercise weekly Get weight bearing exercise at least once weekly Bone density test:  A bone density test is an imaging test that uses a type of X-ray to measure the amount of calcium and other minerals in your bones. The test may be used to diagnose or screen you for  a condition that  causes weak or thin bones (osteoporosis), predict your risk for a broken bone (fracture), or determine how well your osteoporosis treatment is working. The bone density test is recommended for females 65 and older, or females or males <65 if certain risk factors such as thyroid disease, long term use of steroids such as for asthma or rheumatological issues, vitamin D deficiency, estrogen deficiency, family history of osteoporosis, self or family history of fragility fracture in first degree relative.  You are due for updated bone density test at this time.  Please call and schedule this  You are currently on Prolia therapy   Please call to schedule your bone density.   [facility]  [id number], [address] [place], [place] [address]   Heart health: Get at least 150 minutes of aerobic exercise weekly Limit alcohol It is important to maintain a healthy blood pressure and healthy cholesterol numbers  Heart disease screening: Screening for heart disease includes screening for blood pressure, fasting lipids, glucose/diabetes screening, BMI height to weight ratio, reviewed of smoking status, physical activity, and diet.    Goals include blood pressure 120/80 or less, maintaining a healthy lipid/cholesterol profile, preventing diabetes or keeping diabetes numbers under good control, not smoking or using tobacco products, exercising most days per week or at least 150 minutes per week of exercise, and eating healthy variety of fruits and vegetables, healthy oils, and avoiding unhealthy food choices like fried food, fast food, high sugar and high cholesterol foods.    Other tests may possibly include EKG test, CT coronary calcium score, echocardiogram, exercise treadmill stress test.     Vascular disease screening: For high risk individuals including smokers, diabetes, patients with known heart disease or high blood pressure, kidney disease, and others,  screening for vascular disease or atherosclerosis of the arteries is available.  Examples may include carotid ultrasound, abdominal aortic ultrasound, ABI blood flow screening in the legs, thoracic aorta screening.    Medical care options: I recommend you continue to seek care here first for routine care.  We try really hard to have available appointments Monday through Friday daytime hours for sick visits, acute visits, and physicals.  Urgent care should be used for after hours and weekends for significant issues that cannot wait till the next day.  The emergency department should be used for significant potentially life-threatening emergencies.  The emergency department is expensive, can often have long wait times for less significant concerns, so try to utilize primary care, urgent care, or telemedicine when possible to avoid unnecessary trips to the emergency department.  Virtual visits and telemedicine have been introduced since the pandemic started in 2020, and can be convenient ways to receive medical care.  We offer virtual appointments as well to assist you in a variety of options to seek medical care.   Legal Take the time to do a Last Will and Testament, advanced directives including Healthcare Power of Attorney and Living Will documents.  Do not leave your family with burdens that can be handled ahead of time.   Advanced Directives: I recommend you consider completing a Health Care Power of Attorney and Living Will.   These documents respect your wishes and help alleviate burdens on your loved ones if you were to become terminally ill or be in a position to need those documents enforced.    You can complete Advanced Directives yourself, have them notarized, then have copies made for our office, for  you and for anybody you feel should have them in safe keeping.  Or, you can have an attorney prepare these documents.   If you haven't updated your Last Will and Testament in a while, it may  be worthwhile having an attorney prepare these documents together and save on some costs.       Spiritual and Emotional Health Keeping a healthy spiritual life can help you better manage your physical health. Your spiritual life can help you to cope with any issues that may arise with your physical health.  Balance can keep [place] healthy and help [place] to recover.  If you are struggling with your spiritual health there are questions that you may want to ask yourself:  What makes me feel most complete? When do I feel most connected to the rest of the world? Where do I find the most inner strength? What am I doing when I feel whole?  Helpful tips: Being in nature. Some people feel very connected and at peace when they are walking outdoors or are outside. Helping others. Some feel the largest sense of wellbeing when they are of service to others. Being of service can take on many forms. It can be doing volunteer work, being kind to strangers, or offering a hand to a friend in need. Gratitude. Some people find they feel the most connected when they remain grateful. They may make lists of all the things they are grateful for or say a thank you out loud for all they have.    Emotional Health Are you in tune with your emotional health?  Check out this link: http://www.marquez-love.com/   Financial Health Make sure you use a budget for your personal finances Make sure you are insured against risks (health insurance, life insurance, auto insurance, etc) Save more, spend less Set financial goals If you need help in this area, good resources include counseling through [employer] or other community resources, have a meeting with a Social research officer, government, and a good resource is Salley Slaughter podcast    Emily "[name]" was seen today for annual exam.  Diagnoses and all orders for this visit:  Encounter for health maintenance examination in adult -     Comprehensive metabolic panel  with GFR -     CBC -     Lipid panel -     TSH -     Urinalysis, Routine w reflex microscopic -     VITAMIN D 25 Hydroxy (Vit-D Deficiency, Fractures) -     DG Bone Density; Future  Wears hearing aid  Vitamin D deficiency -     VITAMIN D 25 Hydroxy (Vit-D Deficiency, Fractures)  Vaccine counseling  Osteoporosis, unspecified osteoporosis type, unspecified pathological fracture presence -     VITAMIN D 25 Hydroxy (Vit-D Deficiency, Fractures) -     DG Bone Density; Future  Tubular adenoma  Tremor, physiological  Mild cognitive impairment -     TSH  Mixed hyperlipidemia -     Lipid panel  High risk medication use  Gastroesophageal reflux disease without esophagitis  Estrogen deficiency  Dysthymia  Chronic insomnia  DDD (degenerative disc disease), cervical  Allergic rhinitis due to pollen, unspecified seasonality  Alcohol use  Advanced directives, counseling/discussion  Encounter for subsequent annual wellness visit (AWV) in Medicare patient      Follow-up pending labs, yearly for physical

## 2023-11-26 NOTE — Patient Instructions (Signed)
 This visit was a preventative care visit, also known as wellness visit or routine physical.   Topics typically include healthy lifestyle, diet, exercise, preventative care, vaccinations, sick and well care, proper use of emergency dept and after hours care, as well as other concerns.    Separate significant issues discussed:  Chronic insomnia-you have been on Ambien long-term and stable on this medication, does fine on this..  Dysthymia, depression-you seem to do well on fluoxetine Prozac.  Continue current dose  Osteoporosis-continue Prolia injection, get weightbearing and aerobic exercise, get at least 2000 units or more vitamin D daily and at least 1200 mg of calcium daily  Acid reflux-you continue on Prilosec.  Avoid acid reflux trigger foods.  You tend to not do well off this medication  Mild cognitive impairment-  Stay active with reading and interacting with others to keep your mind strong.  If your memory continues to be a concern we can refer you to neurology for further evaluation  Limit alcohol  High cholesterol-continue current medication, rosuvastatin Crestor 10 mg daily, updated labs today  History of left leg fracture 2024.   General Recommendations: Continue to return yearly for your annual wellness and preventative care visits.  This gives [place] a chance to discuss healthy lifestyle, exercise, vaccinations, review your chart record, and perform screenings where appropriate.  I recommend you see your eye doctor yearly for routine vision care.  I recommend you see your dentist yearly for routine dental care including hygiene visits twice yearly.   Vaccination recommendations were reviewed Immunization History  Administered Date(s) Administered   Fluad Quad(high Dose 65+) 06/18/2019, 05/27/2020   Influenza Whole 08/31/2009, 05/02/2011   Influenza, High Dose Seasonal PF 08/14/2013, 08/17/2014, 06/01/2015, 06/19/2016, 06/20/2017, 06/05/2021   Influenza, Seasonal,  Injecte, Preservative Fre 08/22/2012   Influenza-Unspecified 06/05/2022   Moderna Covid-19 Vaccine Bivalent Booster 75yrs & up 06/05/2021   Moderna Sars-Covid-2 Vaccination 10/08/2019, 11/05/2019, 07/04/2020, 06/05/2022   PNEUMOCOCCAL CONJUGATE-20 10/18/2022   Pneumococcal Conjugate-13 08/17/2014   Pneumococcal Polysaccharide-23 08/31/2009   Td 01/18/2021   Tdap 09/03/2009   Zoster Recombinant(Shingrix) 06/15/2020, 10/12/2020   Zoster, Live 08/31/2009     Screening for cancer: Colon cancer screening: Prior or last colon cancer screen: 2021 colonoscopy showing tubular adenoma.  She is due repeat this year but she declines to do any more colon cancer testing.   Breast cancer screening: After age 13, routine mammogram screening is not recommended.  It is up to you whether you continue to do screenings or not  Skin cancer screening: Check your skin regularly for new changes, growing lesions, or other lesions of concern Come in for evaluation if you have skin lesions of concern.  Lung cancer screening: If you have a greater than 20 pack year history of tobacco use, then you may qualify for lung cancer screening with a chest CT scan.   Please call your insurance company to inquire about coverage for this test.  Pancreatic cancer: no current screening test is available routinely recommended.  (Risk factors: Smoking, overweight or obese, diabetes, chronic pancreatitis, work Nurse, mental health, Solicitor, 60 year old or greater, female greater than female, African-American, family history of pancreatic cancer, hereditary breast, ovarian, melanoma, Lynch, Peutz-jeghers).  We currently don't have screenings for other cancers besides breast, cervical, colon, and lung cancers.  If you have a strong family history of cancer or have other cancer screening concerns, please let me know.    Bone health: Get at least 150 minutes of aerobic exercise weekly Get weight bearing exercise  at least  once weekly Bone density test:  A bone density test is an imaging test that uses a type of X-ray to measure the amount of calcium and other minerals in your bones. The test may be used to diagnose or screen you for a condition that causes weak or thin bones (osteoporosis), predict your risk for a broken bone (fracture), or determine how well your osteoporosis treatment is working. The bone density test is recommended for females 65 and older, or females or males <65 if certain risk factors such as thyroid disease, long term use of steroids such as for asthma or rheumatological issues, vitamin D deficiency, estrogen deficiency, family history of osteoporosis, self or family history of fragility fracture in first degree relative.  You are due for updated bone density test at this time.  Please call and schedule this  You are currently on Prolia therapy   Please call to schedule your bone density.   [facility]  [phone] [place], [place] [address]   Heart health: Get at least 150 minutes of aerobic exercise weekly Limit alcohol It is important to maintain a healthy blood pressure and healthy cholesterol numbers  Heart disease screening: Screening for heart disease includes screening for blood pressure, fasting lipids, glucose/diabetes screening, BMI height to weight ratio, reviewed of smoking status, physical activity, and diet.    Goals include blood pressure 120/80 or less, maintaining a healthy lipid/cholesterol profile, preventing diabetes or keeping diabetes numbers under good control, not smoking or using tobacco products, exercising most days per week or at least 150 minutes per week of exercise, and eating healthy variety of fruits and vegetables, healthy oils, and avoiding unhealthy food choices like fried food, fast food, high sugar and high cholesterol foods.    Other tests may possibly include EKG test, CT coronary  calcium score, echocardiogram, exercise treadmill stress test.     Vascular disease screening: For high risk individuals including smokers, diabetes, patients with known heart disease or high blood pressure, kidney disease, and others, screening for vascular disease or atherosclerosis of the arteries is available.  Examples may include carotid ultrasound, abdominal aortic ultrasound, ABI blood flow screening in the legs, thoracic aorta screening.    Medical care options: I recommend you continue to seek care here first for routine care.  We try really hard to have available appointments Monday through Friday daytime hours for sick visits, acute visits, and physicals.  Urgent care should be used for after hours and weekends for significant issues that cannot wait till the next day.  The emergency department should be used for significant potentially life-threatening emergencies.  The emergency department is expensive, can often have long wait times for less significant concerns, so try to utilize primary care, urgent care, or telemedicine when possible to avoid unnecessary trips to the emergency department.  Virtual visits and telemedicine have been introduced since the pandemic started in 2020, and can be convenient ways to receive medical care.  We offer virtual appointments as well to assist you in a variety of options to seek medical care.   Legal Take the time to do a Last Will and Testament, advanced directives including Healthcare Power of Attorney and Living Will documents.  Do not leave your family with burdens that can be handled ahead of time.   Advanced Directives: I recommend you consider completing a Health Care Power of Attorney and Living Will.   These documents respect your wishes  and help alleviate burdens on your loved ones if you were to become terminally ill or be in a position to need those documents enforced.    You can complete Advanced Directives yourself, have them  notarized, then have copies made for our office, for you and for anybody you feel should have them in safe keeping.  Or, you can have an attorney prepare these documents.   If you haven't updated your Last Will and Testament in a while, it may be worthwhile having an attorney prepare these documents together and save on some costs.       Spiritual and Emotional Health Keeping a healthy spiritual life can help you better manage your physical health. Your spiritual life can help you to cope with any issues that may arise with your physical health.  Balance can keep [place] healthy and help [place] to recover.  If you are struggling with your spiritual health there are questions that you may want to ask yourself:  What makes me feel most complete? When do I feel most connected to the rest of the world? Where do I find the most inner strength? What am I doing when I feel whole?  Helpful tips: Being in nature. Some people feel very connected and at peace when they are walking outdoors or are outside. Helping others. Some feel the largest sense of wellbeing when they are of service to others. Being of service can take on many forms. It can be doing volunteer work, being kind to strangers, or offering a hand to a friend in need. Gratitude. Some people find they feel the most connected when they remain grateful. They may make lists of all the things they are grateful for or say a thank you out loud for all they have.    Emotional Health Are you in tune with your emotional health?  Check out this link: http://www.marquez-love.com/   Financial Health Make sure you use a budget for your personal finances Make sure you are insured against risks (health insurance, life insurance, auto insurance, etc) Save more, spend less Set financial goals If you need help in this area, good resources include counseling through [employer] or other community resources, have a meeting with a Psychiatric nurse, and a good resource is [employer]

## 2023-11-27 LAB — COMPREHENSIVE METABOLIC PANEL WITH GFR
ALT: 19 IU/L (ref 0–32)
AST: 18 IU/L (ref 0–40)
Albumin: 4.2 g/dL (ref 3.8–4.8)
Alkaline Phosphatase: 55 IU/L (ref 44–121)
BUN/Creatinine Ratio: 22 (ref 12–28)
BUN: 15 mg/dL (ref 8–27)
Bilirubin Total: 0.3 mg/dL (ref 0.0–1.2)
CO2: 21 mmol/L (ref 20–29)
Calcium: 8.9 mg/dL (ref 8.7–10.3)
Chloride: 105 mmol/L (ref 96–106)
Creatinine, Ser: 0.67 mg/dL (ref 0.57–1.00)
Globulin, Total: 2.6 g/dL (ref 1.5–4.5)
Glucose: 88 mg/dL (ref 70–99)
Potassium: 4.2 mmol/L (ref 3.5–5.2)
Sodium: 140 mmol/L (ref 134–144)
Total Protein: 6.8 g/dL (ref 6.0–8.5)
eGFR: 90 mL/min/{1.73_m2} (ref >59)

## 2023-11-27 LAB — CBC
Hematocrit: 38.5 % (ref 34.0–46.6)
Hemoglobin: 12.6 g/dL (ref 11.1–15.9)
MCH: 31.7 pg (ref 26.6–33.0)
MCHC: 32.7 g/dL (ref 31.5–35.7)
MCV: 97 fL (ref 79–97)
Platelets: 280 10*3/uL (ref 150–450)
RBC: 3.98 x10E6/uL (ref 3.77–5.28)
RDW: 12.8 % (ref 11.7–15.4)
WBC: 5.5 10*3/uL (ref 3.4–10.8)

## 2023-11-27 LAB — URINALYSIS, ROUTINE W REFLEX MICROSCOPIC
Bilirubin, UA: NEGATIVE
Glucose, UA: NEGATIVE
Ketones, UA: NEGATIVE
Leukocytes,UA: NEGATIVE
Nitrite, UA: NEGATIVE
RBC, UA: NEGATIVE
Specific Gravity, UA: 1.021 (ref 1.005–1.030)
Urobilinogen, Ur: 0.2 mg/dL (ref 0.2–1.0)
pH, UA: 8 — ABNORMAL HIGH (ref 5.0–7.5)

## 2023-11-27 LAB — LIPID PANEL
Cholesterol, Total: 166 mg/dL (ref 100–199)
HDL: 61 mg/dL (ref >39)
LDL CALC COMMENT:: 2.7 ratio (ref 0.0–4.4)
LDL Chol Calc (NIH): 83 mg/dL (ref 0–99)
Triglycerides: 127 mg/dL (ref 0–149)
VLDL Cholesterol Cal: 22 mg/dL (ref 5–40)

## 2023-11-27 LAB — VITAMIN D 25 HYDROXY (VIT D DEFICIENCY, FRACTURES): Vit D, 25-Hydroxy: 39.8 ng/mL (ref 30.0–100.0)

## 2023-11-27 LAB — TSH: TSH: 2.29 u[IU]/mL (ref 0.450–4.500)

## 2023-11-27 NOTE — Progress Notes (Signed)
 Results sent through MyChart

## 2023-12-12 ENCOUNTER — Telehealth: Payer: Self-pay

## 2023-12-12 NOTE — Telephone Encounter (Signed)
 Prolia VOB initiated via [email or web address]  Next Prolia inj DUE: 01/21/24

## 2023-12-19 ENCOUNTER — Other Ambulatory Visit (HOSPITAL_COMMUNITY): Payer: Self-pay

## 2023-12-19 ENCOUNTER — Other Ambulatory Visit: Payer: Self-pay | Admitting: Medical

## 2023-12-19 NOTE — Telephone Encounter (Signed)
 Pt ready for scheduling for PROLIA  on or after : 01/21/24  Option# 1: Buy/Bill (Office supplied medication)  Out-of-pocket cost due at time of clinic visit: $ ?  Number of injection/visits approved: 2  Primary: [employer] Prolia  co-insurance: ? Admin fee co-insurance: ?  Secondary: --- Prolia  co-insurance:  Admin fee co-insurance:   Medical Benefit Details: Date Benefits were checked: 12/13/23 Deductible: ?/ Coinsurance: ?/ Admin Fee: ?  Prior Auth: APPROVED PA# [id number] Expiration Date: 08/28/23-08/26/24  # of doses approved: 2 ----------------------------------------------------------------------- Option# 2- Med Obtained from pharmacy:  Pharmacy benefit: Copay $930.81 (Paid to pharmacy) Admin Fee: ? (Pay at clinic)  Prior Auth: N/A PA# Expiration Date:   # of doses approved:   If patient wants fill through the pharmacy benefit please send prescription to: [employer], and include estimated need by date in rx notes. Pharmacy will ship medication directly to the office.  Patient NOT eligible for Prolia  Copay Card. Copay Card can make patient's cost as little as $25. Link to apply: https://www.amgensupportplus.com/copay  ** This summary of benefits is an estimation of the patient's out-of-pocket cost. Exact cost may very based on individual plan coverage.

## 2023-12-19 NOTE — Telephone Encounter (Signed)
 Called [employer]. They cannot provide quotes for non-chemotherapy drugs. Per representative, prescriber must use CMS website

## 2023-12-19 NOTE — Telephone Encounter (Signed)
   Was done already on 11/26/23

## 2023-12-20 ENCOUNTER — Other Ambulatory Visit: Payer: Self-pay | Admitting: Medical

## 2023-12-20 NOTE — Telephone Encounter (Signed)
 Last apt 11/26/23

## 2023-12-27 ENCOUNTER — Telehealth: Payer: Self-pay | Admitting: Internal Medicine

## 2023-12-27 NOTE — Telephone Encounter (Signed)
 Ordered prolia  and [facility] going forward

## 2024-01-22 ENCOUNTER — Other Ambulatory Visit (INDEPENDENT_AMBULATORY_CARE_PROVIDER_SITE_OTHER)

## 2024-01-22 DIAGNOSIS — M810 Age-related osteoporosis without current pathological fracture: Secondary | ICD-10-CM | POA: Diagnosis not present

## 2024-02-05 ENCOUNTER — Other Ambulatory Visit: Payer: Self-pay | Admitting: Medical

## 2024-04-20 ENCOUNTER — Ambulatory Visit (HOSPITAL_BASED_OUTPATIENT_CLINIC_OR_DEPARTMENT_OTHER)
Admission: RE | Admit: 2024-04-20 | Discharge: 2024-04-20 | Disposition: A | Discharge status: Home / Self Care | Source: Ambulatory Visit | Attending: Medical | Admitting: Medical

## 2024-04-20 DIAGNOSIS — M810 Age-related osteoporosis without current pathological fracture: Secondary | ICD-10-CM | POA: Diagnosis not present

## 2024-04-20 DIAGNOSIS — Z0000 Encounter for general adult medical examination without abnormal findings: Secondary | ICD-10-CM | POA: Diagnosis not present

## 2024-04-20 DIAGNOSIS — Z780 Asymptomatic menopausal state: Secondary | ICD-10-CM | POA: Diagnosis not present

## 2024-04-21 ENCOUNTER — Ambulatory Visit: Payer: Self-pay | Admitting: Medical

## 2024-04-21 NOTE — Progress Notes (Signed)
 Awaiting feedback from radiology

## 2024-04-21 NOTE — Progress Notes (Signed)
 Results through MyChart  Verify she is still getting Prolia  I am assuming an osteoporosis clinic now.  If not lets refer to osteoporosis clinic since we do not do the injections here anymore

## 2024-04-21 NOTE — Progress Notes (Signed)
 Hello Dr. Arceo The results says comparison: none.  Her last one is in Epic 2021.  Can you compare to this one?  I just want to make sure we don't need to change therapy? Thanks [employer]

## 2024-05-01 ENCOUNTER — Other Ambulatory Visit: Payer: Self-pay | Admitting: Medical

## 2024-05-01 ENCOUNTER — Telehealth: Payer: Self-pay

## 2024-05-01 MED ORDER — ZOLPIDEM TARTRATE 5 MG PO TABS: 5.0000 mg | ORAL_TABLET | Freq: Every evening | ORAL | 0 refills | Status: DC | PRN

## 2024-05-01 NOTE — Telephone Encounter (Signed)
 Refill Request for Zolpidem  5mg  @ [facility]

## 2024-05-05 ENCOUNTER — Telehealth: Payer: Self-pay

## 2024-05-05 MED ORDER — COVID-19 MRNA VAC-TRIS(PFIZER) 30 MCG/0.3ML IM SUSY: 0.3000 mL | PREFILLED_SYRINGE | Freq: Once | INTRAMUSCULAR | 0 refills | Status: AC

## 2024-05-05 NOTE — Telephone Encounter (Addendum)
 Patient called in to have prescription of Covid vaccine sent to pharmacy, I [facility] to [facility] on [address] @ fax # [phone]/ Correction: Patient called back in to get [facility] [address] In [place]. This was sent in.

## 2024-05-05 NOTE — Telephone Encounter (Signed)
 Sent over order to [employer], [place]  Copied from CRM [id number]. Topic: Clinical - Medication Question >> May 05, 2024  8:53 AM Graeme ORN wrote: Reason for CRM: Patient called to request Covid vaccine[facility]. Will fax to pharmacy. Thank You >> May 05, 2024  9:25 AM Graeme ORN wrote: Patient called back to check status. Let patient know. Confirmed not sent to correct pharmacy. Should be sent to [facility] [place] [place]. Please reach out to patient once changed. Thank You

## 2024-05-07 ENCOUNTER — Telehealth: Payer: Self-pay

## 2024-05-07 NOTE — Telephone Encounter (Unsigned)
 Copied from CRM [id number]. Topic: Clinical - Medication Question >> May 05, 2024  8:53 AM Graeme ORN wrote: Reason for CRM: Patient called to request Covid vaccine. [facility]. Will fax to pharmacy. Thank You >> May 07, 2024 10:10 AM CMA Jon DEL wrote: Prescription refaxed, called pharmacy to make sure they received and they had. Complete >> May 07, 2024  9:23 AM Selinda RAMAN wrote: The patient called back to make sure the prescription is for the Moderna covid vaccine and nothing else and that it is sent to   [id number] [place] [place] - [address] Phone: [phone]  Fax: [phone]     >> May 05, 2024  9:25 AM Graeme ORN wrote: Patient called back to check status. Let patient know. Confirmed not sent to correct pharmacy. [facility] be [facility] to [facility] [place]. Please reach out to patient once changed. Thank You

## 2024-05-07 NOTE — Telephone Encounter (Signed)
 Auth Submission: APPROVED - Renewal Site of care: Site of care: [facility] Payer: [employer] medicare Medication & CPT/J Code(s) submitted: Prolia  (Denosumab ) [id number] Diagnosis Code:  Route of submission (phone, fax, portal): portal Phone # Fax # Auth type: Buy/Bill PB Units/visits requested: 60mg  x 1 dose Reference number: [id number] Approval from: 10/26/20 to 08/26/24  I submitted a new PA with [employer] to have the SOC changed to [facility].

## 2024-07-01 ENCOUNTER — Ambulatory Visit: Payer: Self-pay

## 2024-07-01 NOTE — Telephone Encounter (Signed)
 FYI Only or Action Required?: FYI only for provider: appointment scheduled on 11/6.  Patient was last seen in primary care on 11/26/2023 by Bulah Alm RAMAN, PA-C.  Called Nurse Triage reporting Hair/Scalp Problem.  Symptoms began about a month ago.  Interventions attempted: OTC medications: ScalpIn.  Symptoms are: unchanged.  Triage Disposition: See PCP When Office is Open (Within 3 Days)  Patient/caregiver understands and will follow disposition?: Yes  Copied from CRM #[id number]. Topic: Clinical - Red Word Triage >> Jul 01, 2024  2:36 PM Fonda T wrote: Red Word that prompted transfer to Nurse Triage: Patient calling states she has sores on her scalp that are painful and uncomfortable.   Patient is requesting to be seen for evaluation as soon as possible. Reason for Disposition  [1] Small swelling or lump AND [2] unexplained AND [3] present > 1 week  Answer Assessment - Initial Assessment Questions Bumps on her scalp starting a few months ago. Denies them being open or related to lice. Sharp shooting pains that when she is sitting and not doing anything important are much more noticeable, up and about mild.  Has tried ScalpIn and different shampoos. Unsure if that has made it worse but didn't help. Has dry skin everywhere but not on her scalp/denies dandruff. Unsure if ingrown hair as she cannot visualize. There are 2-3 different sights with bumps about pea sized.  Appt made with PCP for tomorrow 11/6. UC precautions given and understood.  1. APPEARANCE of SWELLING: What does it look like?     Bumps, 2-3 in different areas 2. SIZE: How large is the swelling? (e.g., inches, cm; or compare to size of pinhead, tip of pen, eraser, coin, pea, grape, ping pong ball)      Pea sized  3. LOCATION: Where is the swelling located?     Crown and the front 4. ONSET: When did the swelling start?     A few months 5. COLOR: What color is it? Is there more than one color?     Unsure of  color- even with a mirror its a bad angle  6. PAIN: Is there any pain? If Yes, ask: How bad is the pain? (Scale 1-10; or mild, moderate, severe)       Sharp, burning, shooting pain 6/10 when not distracted 7. ITCH: Does it itch? If Yes, ask: How bad is the itch?      Denies  8. CAUSE: What do you think caused the swelling?     Unsure the cause 9 OTHER SYMPTOMS: Do you have any other symptoms? (e.g., fever)     denies  Protocols used: Skin Lump or Localized Swelling-A-AH

## 2024-07-02 ENCOUNTER — Other Ambulatory Visit: Payer: Self-pay | Admitting: Medical

## 2024-07-02 ENCOUNTER — Ambulatory Visit (INDEPENDENT_AMBULATORY_CARE_PROVIDER_SITE_OTHER): Admitting: Medical

## 2024-07-02 VITALS — BP 120/80 | HR 75 | Ht 67.5 in | Wt 160.0 lb

## 2024-07-02 DIAGNOSIS — R238 Other skin changes: Secondary | ICD-10-CM

## 2024-07-02 DIAGNOSIS — L989 Disorder of the skin and subcutaneous tissue, unspecified: Secondary | ICD-10-CM | POA: Diagnosis not present

## 2024-07-02 DIAGNOSIS — B351 Tinea unguium: Secondary | ICD-10-CM

## 2024-07-02 MED ORDER — TERBINAFINE HCL 250 MG PO TABS: 250.0000 mg | ORAL_TABLET | Freq: Every day | ORAL | 0 refills | Status: AC

## 2024-07-02 MED ORDER — FLUOCINOLONE ACETONIDE 0.01 % EX SOLN: CUTANEOUS | 0 refills | Status: AC

## 2024-07-02 NOTE — Progress Notes (Signed)
 Subjective: Chief Complaint  Patient presents with   Hair/Scalp Problem    Sores on scalp 3-4 on head. Been there 2 months,   History of Present Illness Emily Cowan is a 78 year old female who presents with sores on her scalp.  She has been experiencing sores on her scalp for at least two months, describing them as having a burning sensation rather than itching. The sores are located around specific areas of her scalp. She initially delayed seeking medical attention until the discomfort required the use of a cold pack for relief.  She has very dry skin that flakes off, although she does not believe she has dandruff. She shampoos her hair two to three times a week and has been diligent about her water intake, but these measures have not alleviated her dry skin. She lives alone with her dog and has noted that the air in her house is very dry, despite using an air purifier.  She is currently taking vitamin D , estimating a dosage of around 1000 to 2000 IU daily. She has not used Selsun Blue shampoo or taken fish oil supplements recently. No itching is associated with the sores on her scalp.  She is concerned about toenail fungus for a long time but just hasn't brought it up.  Past Medical History:  Diagnosis Date   Alcohol abuse    Allergic rhinitis    Allergy    seasonal   Arthritis    Cancer (HCC) 2002   BREAST   Depression    Family history of cancer    multiple   Hearing impaired    wears hearing aids   Hemangioma of liver 2005   MRI    Osteoporosis    Seborrheic dermatitis    Vulvodynia    longstanding, failed Premarin cream; can't tolerate pelvic exams   Wears glasses    Current Outpatient Medications on File Prior to Visit  Medication Sig Dispense Refill   Ascorbic Acid (VITAMIN C) 500 MG CAPS      FLUoxetine  (PROZAC ) 40 MG capsule Take 1 capsule (40 mg total) by mouth daily. 90 capsule 2   Glucosamine-Chondroit-Vit C-Mn (GLUCOSAMINE CHONDR 1500  COMPLX) CAPS Take 1 capsule by mouth daily.     Magnesium 250 MG TABS Take 1 tablet by mouth at bedtime.     Multiple Vitamins-Minerals (MULTIVITAMIN WITH MINERALS) tablet Take 1 tablet by mouth daily.       omeprazole  (PRILOSEC) 40 MG capsule Take 1 capsule (40 mg total) by mouth daily. 90 capsule 2   rosuvastatin  (CRESTOR ) 10 MG tablet Take 1 tablet (10 mg total) by mouth at bedtime. 90 tablet 2   zolpidem  (AMBIEN ) 5 MG tablet Take 1 tablet (5 mg total) by mouth at bedtime as needed. for sleep 90 tablet 0   acetaminophen  (TYLENOL ) 325 MG tablet Take 2 tablets (650 mg total) by mouth every 6 (six) hours as needed. 36 tablet 0   Blood Glucose Monitoring Suppl DEVI Test 1-2 times day. Pend on Insurance 1 each 0   Glucose Blood (BLOOD GLUCOSE TEST STRIPS) STRP Test 1-2 times daily 100 strip 0   Lancet Device MISC 1-2 times daily 1 each 0   Lancets Misc. MISC 1-2 times a day 100 each 0   Current Facility-Administered Medications on File Prior to Visit  Medication Dose Route Frequency Provider Last Rate Last Admin   denosumab  (PROLIA ) injection 60 mg  60 mg Subcutaneous Once Susanna Benge S, PA-C  Objective: Physical Exam There are several mild to 3 mm patches of irritated skin and slight erythema on the scalp but no obvious pressure or ulcerations Great toenail on the left with discoloration and hypertrophy consistent with likely onychomycosis   Assessment: Encounter Diagnoses  Name Primary?   Dry scalp Yes   Onychomycosis    Dermatosis of scalp      Plan: Irritated dry skin of scalp Chronic dry scalp with burning sensation, no fungal infection or alopecia. Likely due to dry air and low water intake. Possible suboptimals vitamin D . - Increase vitamin D  to 2000 IU daily. - Consider biotin or collagen supplements. - Prescribed steroid solution daily for 3-4 weeks, then twice weekly. - Use Selsun Blue shampoo weekly, leave on for ten minutes. - Consider humidifier use. -  Consider fish oil supplements.   Onychomycosis -Begin Lamisil 250 mg oral tablet daily.  Discussed risk and benefits of medication -Recheck in 4 to 6 weeks    Emily Cowan was seen today for hair/scalp problem.  Diagnoses and all orders for this visit:  Dry scalp  Onychomycosis  Dermatosis of scalp  Other orders -     fluocinolone (SYNALAR) 0.01 % external solution; Daily topically for 2-3 weeks, then twice weekly for 2 months -     terbinafine (LAMISIL) 250 MG tablet; Take 1 tablet (250 mg total) by mouth daily. -     Cancel: Skin excision    Follow up: 4-6 weeks

## 2024-07-02 NOTE — Patient Instructions (Addendum)
 Assessment and Plan Irritated dry skin of scalp Chronic dry scalp with burning sensation, no fungal infection or alopecia. Possibly triggered by irritation from soap and dry air.   - Increase vitamin D  to 2000 IU daily. - Consider biotin or collagen supplement daily over the counter - Prescribed steroid solution daily for 3 weeks, then twice weekly. - Use Selsun Blue shampoo weekly, leave on for ten minutes, then wash out - Consider humidifier use. - Consider fish oil supplement.

## 2024-07-17 ENCOUNTER — Other Ambulatory Visit: Payer: Self-pay | Admitting: Medical

## 2024-07-20 NOTE — Telephone Encounter (Signed)
 Last appt:11/26/23

## 2024-07-22 ENCOUNTER — Other Ambulatory Visit: Payer: Self-pay | Admitting: Medical

## 2024-07-27 ENCOUNTER — Ambulatory Visit (INDEPENDENT_AMBULATORY_CARE_PROVIDER_SITE_OTHER)

## 2024-07-27 VITALS — BP 138/82 | HR 66 | Temp 98.1°F | Resp 16 | Ht 67.5 in | Wt 161.8 lb

## 2024-07-27 DIAGNOSIS — M810 Age-related osteoporosis without current pathological fracture: Secondary | ICD-10-CM

## 2024-07-27 MED ORDER — DENOSUMAB 60 MG/ML ~~LOC~~ SOSY
60.0000 mg | PREFILLED_SYRINGE | Freq: Once | SUBCUTANEOUS | Status: AC
  Administered 2024-07-27: 60 mg via SUBCUTANEOUS
  Filled 2024-07-27: qty 1

## 2024-07-27 NOTE — Progress Notes (Signed)
 Diagnosis: , Osteoporosis  Provider:  Mannam, Praveen MD  Procedure: Injection  Prolia  (Denosumab ), Dose: 60 mg, Site: subcutaneous, Number of injections: 1  Injection Site(s): Left arm  Post Care: Patient declined observation  Discharge: Condition: Good, Destination: Home . AVS Declined  Performed by:  Leita FORBES Miles, LPN

## 2024-08-04 ENCOUNTER — Other Ambulatory Visit

## 2024-09-29 ENCOUNTER — Other Ambulatory Visit: Payer: Self-pay | Admitting: Medical

## 2024-12-08 ENCOUNTER — Ambulatory Visit: Payer: Self-pay | Admitting: Medical

## 2025-01-25 ENCOUNTER — Ambulatory Visit
